# Patient Record
Sex: Male | Born: 1965
Health system: Southern US, Community
[De-identification: ages and names within clinical notes are randomized; demographics above are authoritative.]

## PROBLEM LIST (undated history)

## (undated) DIAGNOSIS — N200 Calculus of kidney: Secondary | ICD-10-CM

## (undated) HISTORY — PX: LITHOTRIPSY: SUR834

## (undated) HISTORY — PX: VASECTOMY: SHX75

## (undated) HISTORY — DX: Calculus of kidney: N20.0

---

## 1999-03-01 ENCOUNTER — Encounter: Payer: Self-pay | Admitting: Family Medicine

## 1999-03-01 ENCOUNTER — Observation Stay (HOSPITAL_COMMUNITY): Admission: EM | Admit: 1999-03-01 | Discharge: 1999-03-01 | Payer: Self-pay | Admitting: Emergency Medicine

## 1999-10-31 ENCOUNTER — Encounter (INDEPENDENT_AMBULATORY_CARE_PROVIDER_SITE_OTHER): Payer: Self-pay | Admitting: Specialist

## 1999-10-31 ENCOUNTER — Other Ambulatory Visit: Admission: RE | Admit: 1999-10-31 | Discharge: 1999-10-31 | Payer: Self-pay | Admitting: Urology

## 2009-12-09 ENCOUNTER — Ambulatory Visit (HOSPITAL_COMMUNITY): Admission: RE | Admit: 2009-12-09 | Discharge: 2009-12-09 | Payer: Self-pay | Admitting: Family Medicine

## 2011-07-30 IMAGING — CT CT ABDOMEN W/ CM
3 of 5 series · 16 of 32 positions shown, 19 images · IV contrast (agent unspecified)
Comparison: None

CLINICAL DATA: Right abdominal pain after trauma.

CT ABDOMEN WITH CONTRAST
TECHNIQUE: Multidetector CT imaging of the abdomen was performed
using the standard protocol following bolus administration of
intravenous contrast.
Contrast: 100 ml Kmnipaque-MYY

[Series 2: routine abdomen · axial · 0.70mm/px · z∈[-276,-176]mm · 2 of 61 slices shown, 5 images]
[im 21/61  soft-tissue]
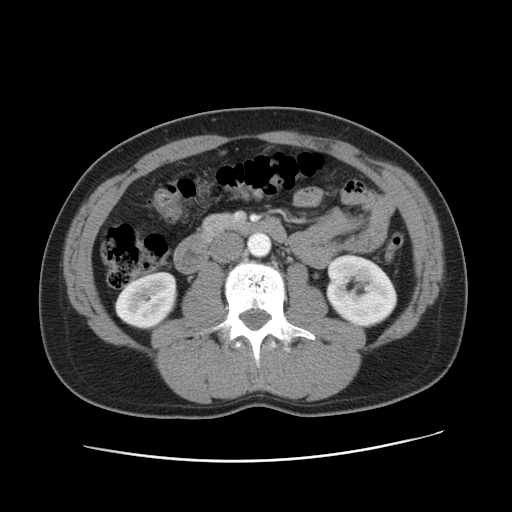
[im 21/61  lung]
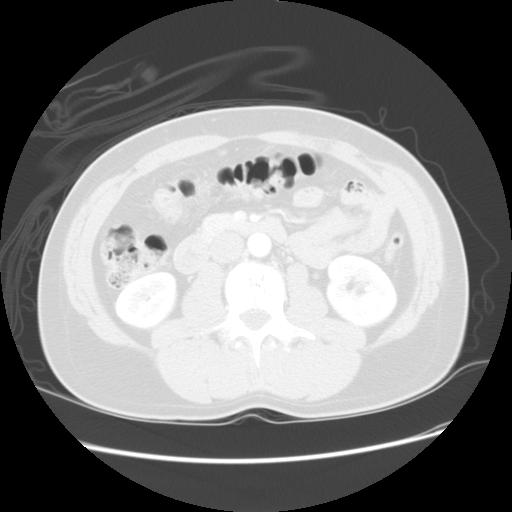
[im 21/61  bone]
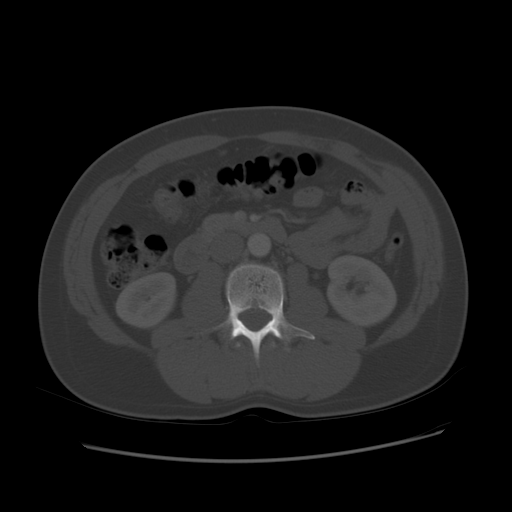
[im 41/61  soft-tissue]
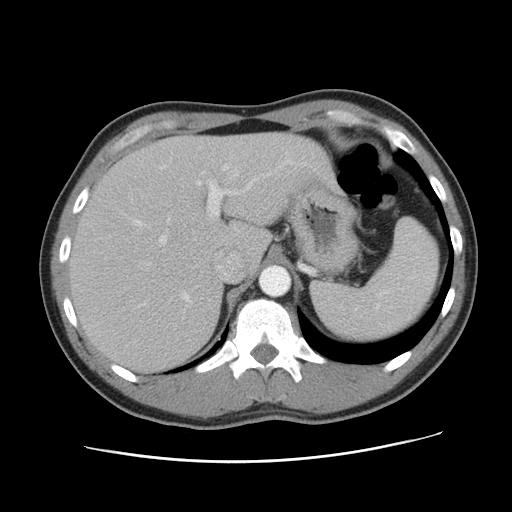
[im 41/61  lung]
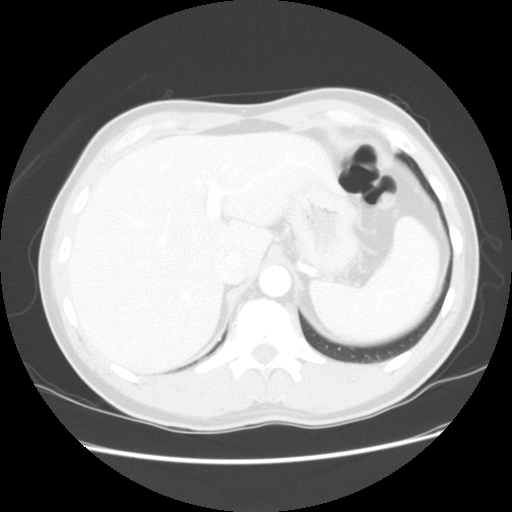

[Series 400: reformatted · sagittal · 0.70mm/px · 8 of 169 slices shown (1 of 2)]
[im 15/169  soft-tissue]
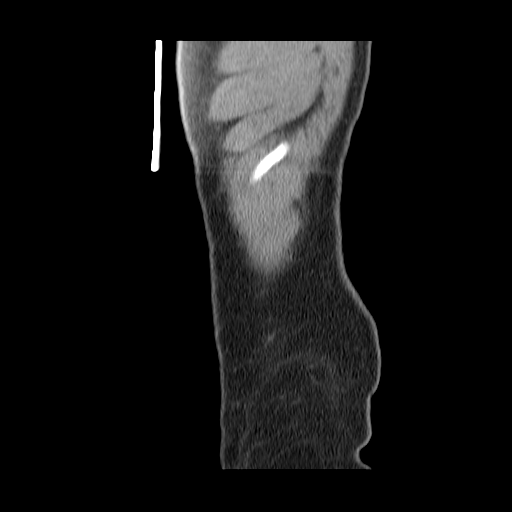
[im 43/169  soft-tissue]
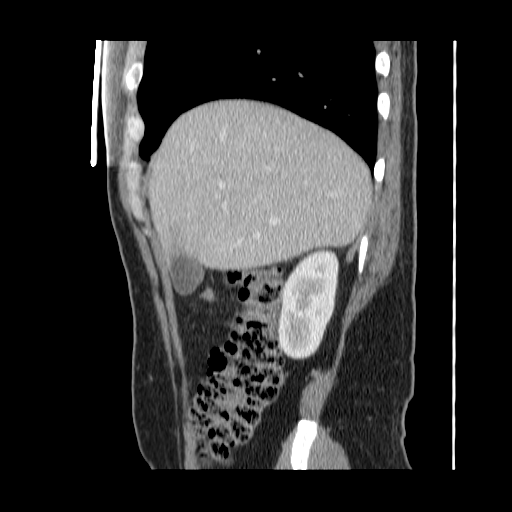
[im 57/169  soft-tissue]
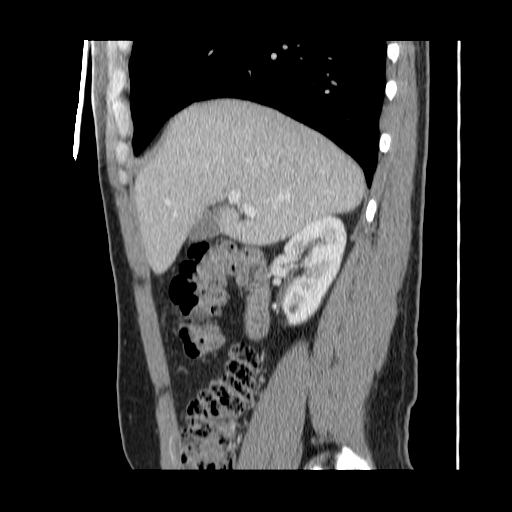
[im 71/169  soft-tissue]
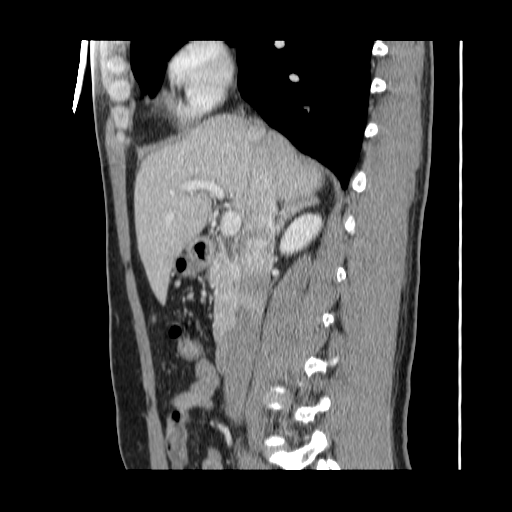
[im 99/169  soft-tissue]
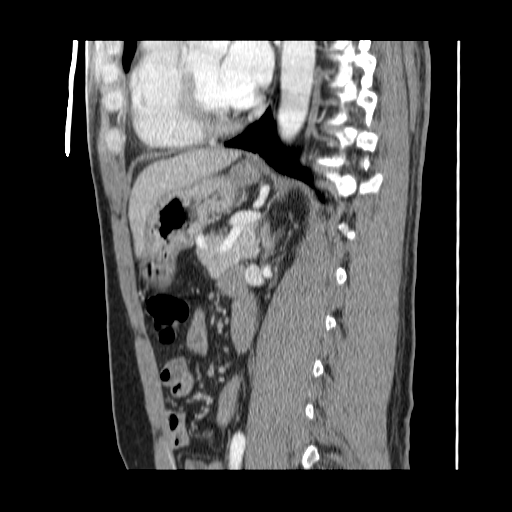
[im 113/169  soft-tissue]
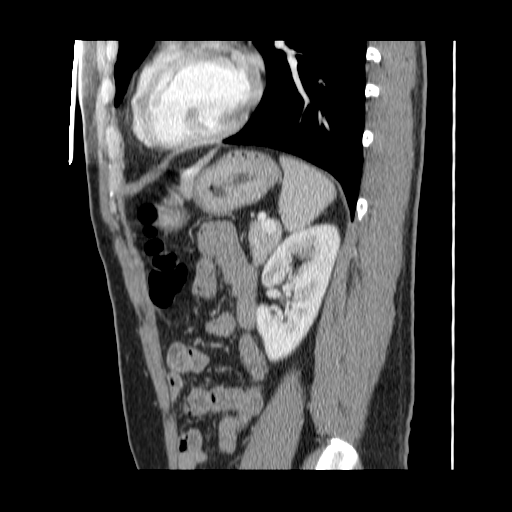
[im 127/169  soft-tissue]
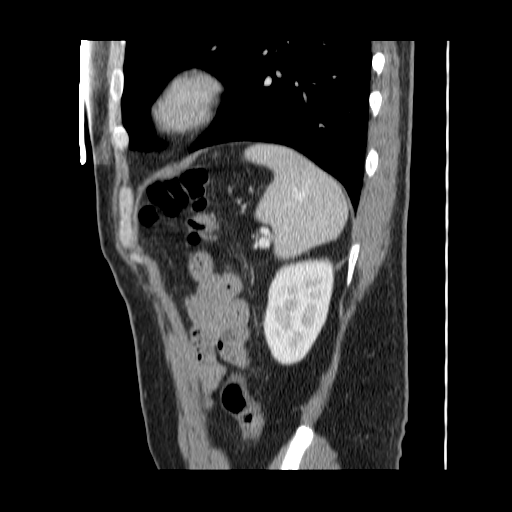
[im 155/169  soft-tissue]
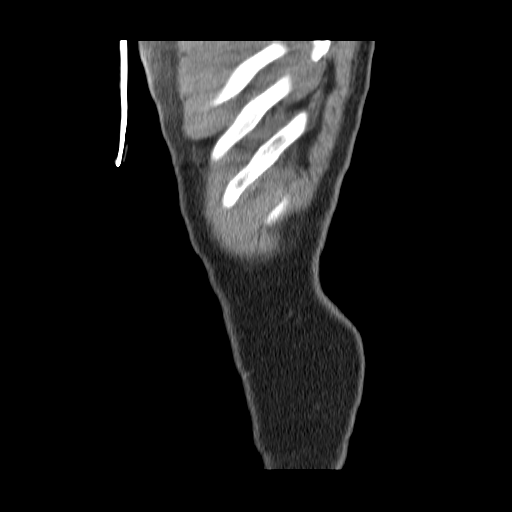

[Series 401: reformatted · coronal · 0.70mm/px · 6 of 133 slices shown (2 of 2)]
[im 15/133  soft-tissue]
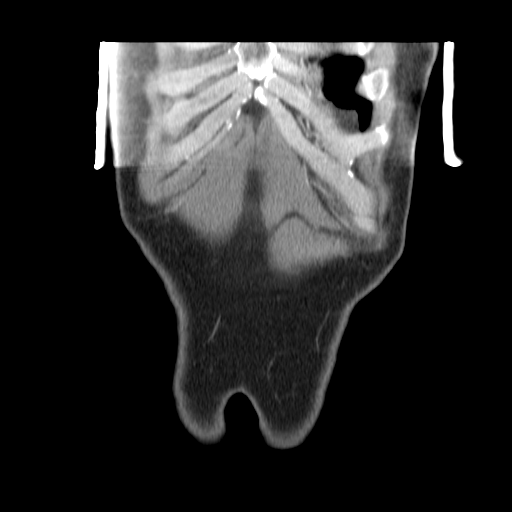
[im 30/133  soft-tissue]
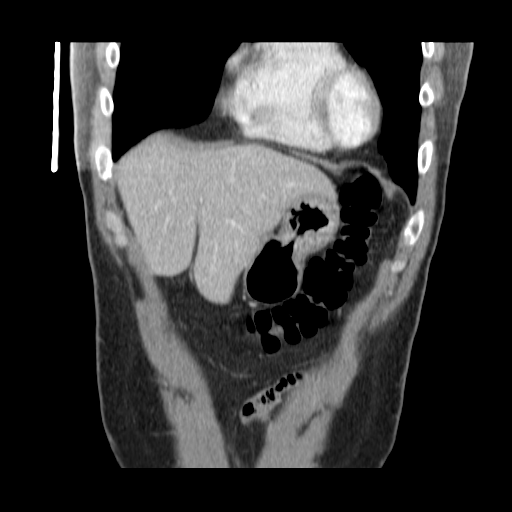
[im 45/133  soft-tissue]
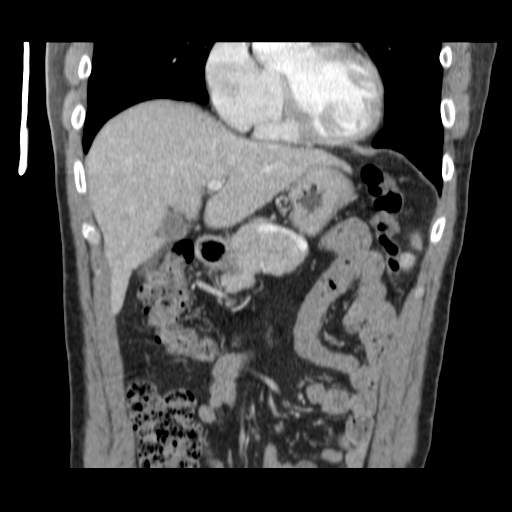
[im 59/133  soft-tissue]
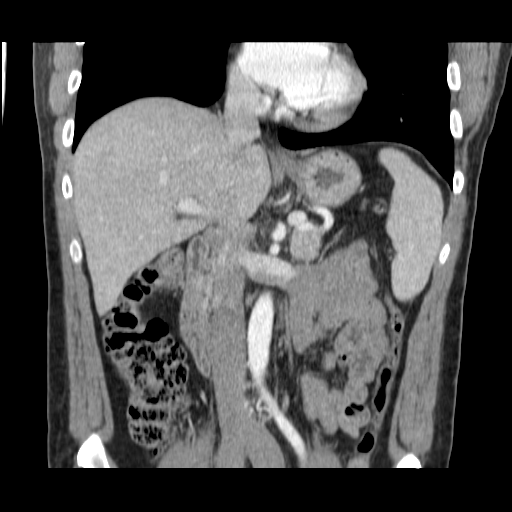
[im 74/133  soft-tissue]
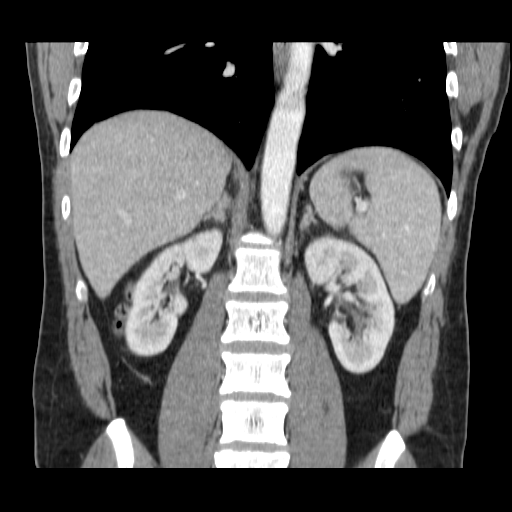
[im 89/133  soft-tissue]
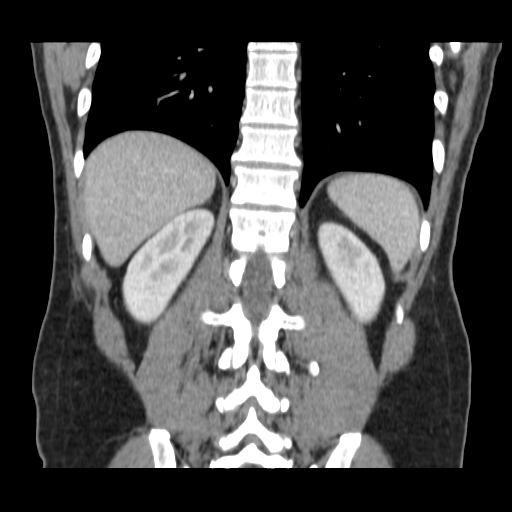

[16 of 32 positions shown; findings below may reference images not displayed]

FINDINGS: The liver, spleen, pancreas, and adrenal glands appear
unremarkable.

The gallbladder and biliary system appear unremarkable.

A 6 mm right kidney upper pole hypodense lesion is likely a cyst.
There is a 2 mm right kidney upper pole nonobstructive calculus.  A
4 mm right kidney lower pole hypodense lesion is likely a cyst.

The left kidney appears normal.

No hydronephrosis or hydroureter identified.

No pathologic retroperitoneal or porta hepatis adenopathy is
identified.

No upper abdominal ascites noted.
IMPRESSION: 1.  2 mm right kidney upper pole nonobstructive calculus.
2.  There are two tiny hypodense right renal lesions likely a cyst.

## 2013-10-29 ENCOUNTER — Ambulatory Visit (INDEPENDENT_AMBULATORY_CARE_PROVIDER_SITE_OTHER): Payer: Commercial Managed Care - PPO | Admitting: Family Medicine

## 2013-10-29 ENCOUNTER — Ambulatory Visit: Payer: Commercial Managed Care - PPO

## 2013-10-29 VITALS — BP 136/86 | HR 60 | Temp 98.0°F | Resp 16 | Ht 68.23 in | Wt 164.2 lb

## 2013-10-29 DIAGNOSIS — R109 Unspecified abdominal pain: Secondary | ICD-10-CM

## 2013-10-29 DIAGNOSIS — N200 Calculus of kidney: Secondary | ICD-10-CM

## 2013-10-29 LAB — POCT URINALYSIS DIPSTICK
Bilirubin, UA: NEGATIVE
Glucose, UA: NEGATIVE
Ketones, UA: NEGATIVE
Leukocytes, UA: NEGATIVE
Nitrite, UA: NEGATIVE
Protein, UA: NEGATIVE
Spec Grav, UA: 1.015
Urobilinogen, UA: 0.2
pH, UA: 5.5

## 2013-10-29 LAB — POCT UA - MICROSCOPIC ONLY
Casts, Ur, LPF, POC: NEGATIVE
Crystals, Ur, HPF, POC: NEGATIVE
Epithelial cells, urine per micros: NEGATIVE
Mucus, UA: NEGATIVE
Yeast, UA: NEGATIVE

## 2013-10-29 MED ORDER — OXYCODONE-ACETAMINOPHEN 5-325 MG PO TABS: 1.0000 | ORAL_TABLET | Freq: Three times a day (TID) | ORAL | Status: DC | PRN

## 2013-10-29 MED ORDER — KETOROLAC TROMETHAMINE 60 MG/2ML IM SOLN
60.0000 mg | Freq: Once | INTRAMUSCULAR | Status: AC
  Administered 2013-10-29: 60 mg via INTRAMUSCULAR

## 2013-10-29 MED ORDER — KETOROLAC TROMETHAMINE 10 MG PO TABS: 10.0000 mg | ORAL_TABLET | Freq: Four times a day (QID) | ORAL | Status: DC | PRN

## 2013-10-29 MED ORDER — TAMSULOSIN HCL 0.4 MG PO CAPS: 0.4000 mg | ORAL_CAPSULE | Freq: Every day | ORAL | Status: DC

## 2013-10-29 NOTE — Progress Notes (Signed)
Subjective:    Patient ID: Jeremy Hunter, male    DOB: 11/26/1965, 48 y.o.   MRN: [id number] Chief Complaint  Patient presents with  . Nephrolithiasis    HPI  1 wk previosly pt had severe right flank pain x 3 hrs - went away on its own - thought he had just overdone it and that it was a back muscle spasm. Then flaired up again 2d prev but this time the pain was much more severe.  Went away o/n and then returned yesterday morning and has been intermittent since. Pain is so severe it is causing him to pace and clutch his flank, causing n/v.  Pain is at the same place as it was a wk ago - has not changed location at all.  Waxes and wanes in intesntiy now - no longer completely leaving him as it had prior. No f/c. No abd pain, n/v due to severe pain only, no c/d.  No urine changes to all - voiding and urine is completely normal. Has been taking some tylenol or motrin for pain but no longer helping - has only used tylenol today, no motrin today. No h/o kidney stones but assumes that this is what it might be as he feels like he is in labor pain is so severe.  Has been drinking water while in the office to provide us a urine sample but can't void right now. Prior to this has not been drinking much water at all - thought that might make kidney stone pain worse.  History reviewed. No pertinent past medical history. No current outpatient prescriptions on file prior to visit.   No current facility-administered medications on file prior to visit.   No Known Allergies   Review of Systems  Constitutional: Negative for fever, chills, diaphoresis, activity change, appetite change and fatigue.  Gastrointestinal: Positive for nausea and vomiting. Negative for abdominal pain, diarrhea, constipation and abdominal distention.  Endocrine: Negative for polydipsia and polyuria.  Genitourinary: Positive for flank pain. Negative for dysuria, urgency, frequency, hematuria, decreased urine volume, discharge, penile  swelling, scrotal swelling, enuresis, difficulty urinating, genital sores, penile pain and testicular pain.  Musculoskeletal: Positive for back pain. Negative for gait problem and joint swelling.  Neurological: Negative for dizziness, syncope and light-headedness.  Hematological: Negative for adenopathy. Does not bruise/bleed easily.  Psychiatric/Behavioral: Negative for sleep disturbance.      BP 136/86  Pulse 60  Temp(Src) 98 F (36.7 C) (Oral)  Resp 16  Ht 5' 8.23" (1.733 m)  Wt 164 lb 4 oz (74.503 kg)  BMI 24.81 kg/m2  SpO2 99% Objective:   Physical Exam  Constitutional: He appears well-developed and well-nourished. No distress.  He is pacing the room clutching his right flank, vomiting due to pain  HENT:  Head: Normocephalic and atraumatic.  Neck: Normal range of motion. Neck supple. No thyromegaly present.  Cardiovascular: Normal rate, regular rhythm and normal heart sounds.   Pulmonary/Chest: Effort normal and breath sounds normal.  Abdominal: Soft. Normal appearance and bowel sounds are normal. He exhibits no distension and no mass. There is no tenderness. There is no rebound, no guarding and no CVA tenderness. No hernia.  Genitourinary: Rectal exam shows no tenderness and anal tone normal.  Lymphadenopathy:    He has no cervical adenopathy.  Skin: He is not diaphoretic.     [facility] reading (PRIMARY) by  Dr. Clelia CroftShaw. KUB: right kidney stone - still near kidney pelvis  EXAM: ABDOMEN - 1 VIEW  COMPARISON: CT abdomen  and pelvis 12/09/2009.  FINDINGS: There is a 0.7 cm in diameter radiopaque density between the left L3 and L4 process consistent with a ureteral stone. No other abnormal calcifications are seen. The bowel gas pattern is normal. No bony abnormality is identified.  IMPRESSION: Findings consistent with a mid left ureteral stone. Assessment & Plan:   Flank pain, acute - Plan: POCT UA - Microscopic Only, POCT urinalysis dipstick, Urine culture, DG Abd 1 View,  ketorolac (TORADOL) injection 60 mg  Right nephrolithiasis - Plan: ketorolac (TORADOL) injection 60 mg - pt has not been drinking much water so will start pushing fluids, start flomax w/ prn pain meds, and strain urine. If flank pain has not moved within 24 hrs, stone may be to big for pt to pass so recommend he call office by tomorrow early afternoon so I can order stat abd CT w/ renal stone protocol to visualize size of stone and refer to urology for further management if >6 mm.  Meds ordered this encounter  Medications  . tamsulosin (FLOMAX) 0.4 MG CAPS capsule    Sig: Take 1 capsule (0.4 mg total) by mouth daily.    Dispense:  30 capsule    Refill:  0  . oxyCODONE-acetaminophen (ROXICET) 5-325 MG per tablet    Sig: Take 1 tablet by mouth every 8 (eight) hours as needed for severe pain.    Dispense:  20 tablet    Refill:  0  . ketorolac (TORADOL) 10 MG tablet    Sig: Take 1 tablet (10 mg total) by mouth every 6 (six) hours as needed.    Dispense:  12 tablet    Refill:  0  . ketorolac (TORADOL) injection 60 mg    Sig:     Norberto Sorenson, MD MPH

## 2013-10-29 NOTE — Patient Instructions (Signed)
Kidney Stones  Kidney stones (urolithiasis) are deposits that form inside your kidneys. The intense pain is caused by the stone moving through the urinary tract. When the stone moves, the ureter goes into spasm around the stone. The stone is usually passed in the urine.   CAUSES   · A disorder that makes certain neck glands produce too much parathyroid hormone (primary hyperparathyroidism).  · A buildup of uric acid crystals, similar to gout in your joints.  · Narrowing (stricture) of the ureter.  · A kidney obstruction present at birth (congenital obstruction).  · Previous surgery on the kidney or ureters.  · Numerous kidney infections.  SYMPTOMS   · Feeling sick to your stomach (nauseous).  · Throwing up (vomiting).  · Blood in the urine (hematuria).  · Pain that usually spreads (radiates) to the groin.  · Frequency or urgency of urination.  DIAGNOSIS   · Taking a history and physical exam.  · Blood or urine tests.  · CT scan.  · Occasionally, an examination of the inside of the urinary bladder (cystoscopy) is performed.  TREATMENT   · Observation.  · Increasing your fluid intake.  · Extracorporeal shock wave lithotripsy This is a noninvasive procedure that uses shock waves to break up kidney stones.  · Surgery may be needed if you have severe pain or persistent obstruction. There are various surgical procedures. Most of the procedures are performed with the use of small instruments. Only small incisions are needed to accommodate these instruments, so recovery time is minimized.  The size, location, and chemical composition are all important variables that will determine the proper choice of action for you. Talk to your health care provider to better understand your situation so that you will minimize the risk of injury to yourself and your kidney.   HOME CARE INSTRUCTIONS   · Drink enough water and fluids to keep your urine clear or pale yellow. This will help you to pass the stone or stone fragments.  · Strain  all urine through the provided strainer. Keep all particulate matter and stones for your health care provider to see. The stone causing the pain may be as small as a grain of salt. It is very important to use the strainer each and every time you pass your urine. The collection of your stone will allow your health care provider to analyze it and verify that a stone has actually passed. The stone analysis will often identify what you can do to reduce the incidence of recurrences.  · Only take over-the-counter or prescription medicines for pain, discomfort, or fever as directed by your health care provider.  · Make a follow-up appointment with your health care provider as directed.  · Get follow-up X-rays if required. The absence of pain does not always mean that the stone has passed. It may have only stopped moving. If the urine remains completely obstructed, it can cause loss of kidney function or even complete destruction of the kidney. It is your responsibility to make sure X-rays and follow-ups are completed. Ultrasounds of the kidney can show blockages and the status of the kidney. Ultrasounds are not associated with any radiation and can be performed easily in a matter of minutes.  SEEK MEDICAL CARE IF:  · You experience pain that is progressive and unresponsive to any pain medicine you have been prescribed.  SEEK IMMEDIATE MEDICAL CARE IF:   · Pain cannot be controlled with the prescribed medicine.  · You have a fever   or shaking chills.  · The severity or intensity of pain increases over 18 hours and is not relieved by pain medicine.  · You develop a new onset of abdominal pain.  · You feel faint or pass out.  · You are unable to urinate.  MAKE SURE YOU:   · Understand these instructions.  · Will watch your condition.  · Will get help right away if you are not doing well or get worse.  Document Released: 05/11/2005 Document Revised: 01/11/2013 Document Reviewed: 10/12/2012  [employer]® Patient Information ©2014  [employer].

## 2013-10-30 ENCOUNTER — Telehealth: Payer: Self-pay

## 2013-10-30 DIAGNOSIS — N200 Calculus of kidney: Secondary | ICD-10-CM

## 2013-10-30 LAB — URINE CULTURE
Colony Count: NO GROWTH
Organism ID, Bacteria: NO GROWTH

## 2013-10-30 NOTE — Telephone Encounter (Signed)
Pt saw Clelia Croft yesterday and was told to call back if he wasn't any better.  Wife says he is throwing up if he eats and he cant take the pain medicine without eating.  He tried taking the medicine by itself and threw it up also.  Please call [phone]

## 2013-10-31 ENCOUNTER — Other Ambulatory Visit: Payer: Self-pay | Admitting: *Deleted

## 2013-10-31 DIAGNOSIS — N200 Calculus of kidney: Secondary | ICD-10-CM

## 2013-10-31 MED ORDER — ONDANSETRON 8 MG PO TBDP: 8.0000 mg | ORAL_TABLET | Freq: Three times a day (TID) | ORAL | Status: DC | PRN

## 2013-10-31 NOTE — Telephone Encounter (Signed)
Jeremy Hunter stated Pt is feeling better this morning. He did go to work today. He works until 230pm. He is going to call us back to discuss the CT Scan. Wife says to hold off on scheduling CT until we talk to him.

## 2013-10-31 NOTE — Telephone Encounter (Signed)
Spoke to patient. He is not in very much pain. He states he just feels "strange" We figured out he has not been taking the medication properly. Went over directions for pain medication and flomax. Advised pt to p/u Zofran at the pharmacy. Pt was taking Oxicodone every 4 hours, Ketoralac at bedtime, Flomax every 6 hours. Advised him to stop taking the Oxicodone medication unless he needs it due to pain. Pt reports he has not had a bowel movement since Sunday. Advised him to get some stool softener, increase fluids since he took so much Oxicodone. Pt would like to proceed with the referral to Urology instead of the CT Scan. He declined injection at this time-wants to try to take the medication properly.

## 2013-10-31 NOTE — Telephone Encounter (Signed)
Oh no! I am assuming the nausea and vomiting is from the pain severity and not just from the toradol and oxycodone. . .   Lets proceed w/ a stat abd CT - renal stone protocol this morning - referral entered and come by the office first thing this morning - fast track for toradol injection (which he received in the office 6/7 and seemed to help some w/ the pain after 30-60 min). Have also sent nausea medicine to the pharmacy in hopes that he can at least then keep down the pain medicine.

## 2013-10-31 NOTE — Telephone Encounter (Signed)
Agree w excellent plan.

## 2013-11-01 ENCOUNTER — Telehealth: Payer: Self-pay | Admitting: *Deleted

## 2013-11-01 NOTE — Telephone Encounter (Signed)
Lm for pt to rtn call- Follow up to see how he is doing after correcting his medication.

## 2013-11-03 NOTE — Telephone Encounter (Signed)
Left message on machine to call back  

## 2014-04-05 ENCOUNTER — Ambulatory Visit (INDEPENDENT_AMBULATORY_CARE_PROVIDER_SITE_OTHER): Payer: Commercial Managed Care - PPO

## 2014-04-05 ENCOUNTER — Ambulatory Visit (INDEPENDENT_AMBULATORY_CARE_PROVIDER_SITE_OTHER): Payer: Commercial Managed Care - PPO | Admitting: Family Medicine

## 2014-04-05 VITALS — BP 138/82 | HR 86 | Temp 98.3°F | Resp 22

## 2014-04-05 DIAGNOSIS — R109 Unspecified abdominal pain: Secondary | ICD-10-CM

## 2014-04-05 DIAGNOSIS — R10A1 Flank pain, right side: Secondary | ICD-10-CM

## 2014-04-05 DIAGNOSIS — R112 Nausea with vomiting, unspecified: Secondary | ICD-10-CM

## 2014-04-05 DIAGNOSIS — N200 Calculus of kidney: Secondary | ICD-10-CM

## 2014-04-05 LAB — POCT URINALYSIS DIPSTICK
BILIRUBIN UA: NEGATIVE
GLUCOSE UA: NEGATIVE
Leukocytes, UA: NEGATIVE
Nitrite, UA: NEGATIVE
Protein, UA: 30
Urobilinogen, UA: 0.2
pH, UA: 5

## 2014-04-05 LAB — POCT CBC
GRANULOCYTE PERCENT: 83.1 % — AB (ref 37–80)
HEMATOCRIT: 44.8 % (ref 43.5–53.7)
Hemoglobin: 15.2 g/dL (ref 14.1–18.1)
LYMPH, POC: 1.3 (ref 0.6–3.4)
MCH, POC: 29.1 pg (ref 27–31.2)
MCHC: 34.1 g/dL (ref 31.8–35.4)
MCV: 85.4 fL (ref 80–97)
MID (CBC): 1 — AB (ref 0–0.9)
MPV: 6.9 fL (ref 0–99.8)
PLATELET COUNT, POC: 281 10*3/uL (ref 142–424)
POC Granulocyte: 11.4 — AB (ref 2–6.9)
POC LYMPH %: 9.7 % — AB (ref 10–50)
POC MID %: 7.2 %M (ref 0–12)
RBC: 5.25 M/uL (ref 4.69–6.13)
RDW, POC: 13 %
WBC: 13.7 10*3/uL — AB (ref 4.6–10.2)

## 2014-04-05 LAB — POCT UA - MICROSCOPIC ONLY
CRYSTALS, UR, HPF, POC: NEGATIVE
Casts, Ur, LPF, POC: NEGATIVE
Mucus, UA: NEGATIVE
Yeast, UA: NEGATIVE

## 2014-04-05 MED ORDER — KETOROLAC TROMETHAMINE 10 MG PO TABS: 10.0000 mg | ORAL_TABLET | Freq: Four times a day (QID) | ORAL | Status: DC | PRN

## 2014-04-05 MED ORDER — PROMETHAZINE HCL 25 MG/ML IJ SOLN
25.0000 mg | Freq: Once | INTRAMUSCULAR | Status: AC
  Administered 2014-04-05: 25 mg via INTRAMUSCULAR

## 2014-04-05 MED ORDER — ONDANSETRON 8 MG PO TBDP: 8.0000 mg | ORAL_TABLET | Freq: Three times a day (TID) | ORAL | Status: DC | PRN

## 2014-04-05 MED ORDER — OXYCODONE-ACETAMINOPHEN 5-325 MG PO TABS: 1.0000 | ORAL_TABLET | Freq: Four times a day (QID) | ORAL | Status: DC | PRN

## 2014-04-05 MED ORDER — KETOROLAC TROMETHAMINE 60 MG/2ML IM SOLN
60.0000 mg | Freq: Once | INTRAMUSCULAR | Status: AC
  Administered 2014-04-05: 60 mg via INTRAMUSCULAR

## 2014-04-05 NOTE — Patient Instructions (Signed)
1.  CALL UROLOGIST TOMORROW FOR AN APPOINTMENT.   Kidney Stones Kidney stones (urolithiasis) are deposits that form inside your kidneys. The intense pain is caused by the stone moving through the urinary tract. When the stone moves, the ureter goes into spasm around the stone. The stone is usually passed in the urine.  CAUSES   A disorder that makes certain neck glands produce too much parathyroid hormone (primary hyperparathyroidism).  A buildup of uric acid crystals, similar to gout in your joints.  Narrowing (stricture) of the ureter.  A kidney obstruction present at birth (congenital obstruction).  Previous surgery on the kidney or ureters.  Numerous kidney infections. SYMPTOMS   Feeling sick to your stomach (nauseous).  Throwing up (vomiting).  Blood in the urine (hematuria).  Pain that usually spreads (radiates) to the groin.  Frequency or urgency of urination. DIAGNOSIS   Taking a history and physical exam.  Blood or urine tests.  CT scan.  Occasionally, an examination of the inside of the urinary bladder (cystoscopy) is performed. TREATMENT   Observation.  Increasing your fluid intake.  Extracorporeal shock wave lithotripsy--This is a noninvasive procedure that uses shock waves to break up kidney stones.  Surgery may be needed if you have severe pain or persistent obstruction. There are various surgical procedures. Most of the procedures are performed with the use of small instruments. Only small incisions are needed to accommodate these instruments, so recovery time is minimized. The size, location, and chemical composition are all important variables that will determine the proper choice of action for you. Talk to your health care provider to better understand your situation so that you will minimize the risk of injury to yourself and your kidney.  HOME CARE INSTRUCTIONS   Drink enough water and fluids to keep your urine clear or pale yellow. This will help  you to pass the stone or stone fragments.  Strain all urine through the provided strainer. Keep all particulate matter and stones for your health care provider to see. The stone causing the pain may be as small as a grain of salt. It is very important to use the strainer each and every time you pass your urine. The collection of your stone will allow your health care provider to analyze it and verify that a stone has actually passed. The stone analysis will often identify what you can do to reduce the incidence of recurrences.  Only take over-the-counter or prescription medicines for pain, discomfort, or fever as directed by your health care provider.  Make a follow-up appointment with your health care provider as directed.  Get follow-up X-rays if required. The absence of pain does not always mean that the stone has passed. It may have only stopped moving. If the urine remains completely obstructed, it can cause loss of kidney function or even complete destruction of the kidney. It is your responsibility to make sure X-rays and follow-ups are completed. Ultrasounds of the kidney can show blockages and the status of the kidney. Ultrasounds are not associated with any radiation and can be performed easily in a matter of minutes. SEEK MEDICAL CARE IF:  You experience pain that is progressive and unresponsive to any pain medicine you have been prescribed. SEEK IMMEDIATE MEDICAL CARE IF:   Pain cannot be controlled with the prescribed medicine.  You have a fever or shaking chills.  The severity or intensity of pain increases over 18 hours and is not relieved by pain medicine.  You develop a new onset  of abdominal pain.  You feel faint or pass out.  You are unable to urinate. MAKE SURE YOU:   Understand these instructions.  Will watch your condition.  Will get help right away if you are not doing well or get worse. Document Released: 05/11/2005 Document Revised: 01/11/2013 Document  Reviewed: 10/12/2012 [facility] Patient Information 2015 [place], [place]. This information is not intended to replace advice given to you by your health care provider. Make sure you discuss any questions you have with your health care provider.

## 2014-04-05 NOTE — Progress Notes (Addendum)
Subjective:  This chart was scribed for [name], MD by Elveria Risingimelie Horne, Medial Scribe. This patient was seen in room 12 and the patient's care was started at 8:13 PM.    Patient ID: Jeremy Hunter, male    DOB: 11/06/1965, 48 y.o.   MRN: [id number]  04/05/2014  Kidney Stone   HPI HPI Comments: Jeremy Hunter is a 48 y.o. male with history of kidney stone who presents to the [facility] complaining of pain associated to kidney stone. Patient reports nausea, vomiting, chills, and diaphoresis onset three hours ago. However, patient reports mild discomfort felt throughout the day that climaxed this evening. Patient reports similar pain felt with his last occurrence in 11/2013, but his current pain is more severe. Patient has taken Oxycodone at 5:00pm which historically provides relief; today his dose did not touch his pain. Pt did vomit immediately after taking  Oxycodone dose.    Urology note, dated 12/07/13, shows visualization of 7 mm kidney stone in right ureter. Moderate blood at the urology visit.  Repeat KUB indicated tht the stone had moved down to mid ureter just proximal to right UVJ.  Pt does not think that he ever passed the stone. He has been pain free since urology visit on 12/07/13 until today. He has been training for a marathon.  Denies diarrhea or constipation.  Denies dysuria or hematuria.  Pain today is in R flank and radiates to R abdomen just like in July 2015.   Review of Systems  Constitutional: Positive for diaphoresis. Negative for fever, chills and fatigue.  HENT: Negative for congestion.   Respiratory: Negative for cough and shortness of breath.   Cardiovascular: Negative for chest pain, palpitations and leg swelling.  Gastrointestinal: Positive for nausea and vomiting. Negative for abdominal pain, diarrhea, constipation, blood in stool, abdominal distention, anal bleeding and rectal pain.  Genitourinary: Positive for flank pain. Negative for dysuria,  urgency, frequency, hematuria, decreased urine volume, discharge, penile swelling, scrotal swelling, difficulty urinating, genital sores, penile pain and testicular pain.  Skin: Negative for rash.  Neurological: Negative for headaches.    Past Medical History  Diagnosis Date  . Nephrolithiasis    Past Surgical History  Procedure Laterality Date  . Vasectomy     No Known Allergies Current Outpatient Prescriptions  Medication Sig Dispense Refill  . ketorolac (TORADOL) 10 MG tablet Take 1 tablet (10 mg total) by mouth every 6 (six) hours as needed. 20 tablet 0  . ondansetron (ZOFRAN-ODT) 8 MG disintegrating tablet Take 1 tablet (8 mg total) by mouth every 8 (eight) hours as needed for nausea. 21 tablet 0  . oxyCODONE-acetaminophen (ROXICET) 5-325 MG per tablet Take 1 tablet by mouth every 6 (six) hours as needed for severe pain. 30 tablet 0  . tamsulosin (FLOMAX) 0.4 MG CAPS capsule Take 1 capsule (0.4 mg total) by mouth daily. 30 capsule 0   No current facility-administered medications for this visit.       Objective:    BP 138/82 mmHg  Pulse 86  Temp(Src) 98.3 F (36.8 C) (Oral)  Resp 22  SpO2 99% Physical Exam  Constitutional: He is oriented to person, place, and time. He appears well-developed and well-nourished.  Diaphoretic; pale; sweating profusely.  Pacing the exam room or leaning forward onto B knees.  HENT:  Head: Normocephalic and atraumatic.  Eyes: Conjunctivae and EOM are normal. Pupils are equal, round, and reactive to light.  Neck: Normal range of motion. Neck supple. Carotid bruit  is not present. No thyromegaly present.  Cardiovascular: Normal rate, regular rhythm, normal heart sounds and intact distal pulses.  Exam reveals no gallop and no friction rub.   No murmur heard. Pulmonary/Chest: Effort normal and breath sounds normal. He has no wheezes. He has no rales.  Abdominal: Soft. Bowel sounds are normal. He exhibits no distension and no mass. There is no  tenderness. There is no rebound and no guarding.  Lymphadenopathy:    He has no cervical adenopathy.  Neurological: He is alert and oriented to person, place, and time. No cranial nerve deficit.  Skin: Skin is warm and dry. No rash noted. He is not diaphoretic.  Psychiatric: He has a normal mood and affect. His behavior is normal.  Nursing note and vitals reviewed.  Results for orders placed or performed in visit on 04/05/14  Urine culture  Result Value Ref Range   Colony Count NO GROWTH    Organism ID, Bacteria NO GROWTH   Comprehensive metabolic panel  Result Value Ref Range   Sodium 140 135 - 145 mEq/L   Potassium 4.2 3.5 - 5.3 mEq/L   Chloride 107 96 - 112 mEq/L   CO2 20 19 - 32 mEq/L   Glucose, Bld 135 (H) 70 - 99 mg/dL   BUN 12 6 - 23 mg/dL   Creat 1.611.01 0.960.50 - 0.451.35 mg/dL   Total Bilirubin 0.9 0.2 - 1.2 mg/dL   Alkaline Phosphatase 59 39 - 117 U/L   AST 19 0 - 37 U/L   ALT 13 0 - 53 U/L   Total Protein 6.9 6.0 - 8.3 g/dL   Albumin 4.5 3.5 - 5.2 g/dL   Calcium 9.5 8.4 - 40.910.5 mg/dL  POCT urinalysis dipstick  Result Value Ref Range   Color, UA amber    Clarity, UA hazy    Glucose, UA neg    Bilirubin, UA neg    Ketones, UA trace    Spec Grav, UA >=1.030    Blood, UA large    pH, UA 5.0    Protein, UA 30    Urobilinogen, UA 0.2    Nitrite, UA neg    Leukocytes, UA Negative   POCT UA - Microscopic Only  Result Value Ref Range   WBC, Ur, HPF, POC 1-3    RBC, urine, microscopic 20-25    Bacteria, U Microscopic trace    Mucus, UA neg    Epithelial cells, urine per micros 1-2    Crystals, Ur, HPF, POC neg    Casts, Ur, LPF, POC neg    Yeast, UA neg   POCT CBC  Result Value Ref Range   WBC 13.7 (A) 4.6 - 10.2 K/uL   Lymph, poc 1.3 0.6 - 3.4   POC LYMPH PERCENT 9.7 (A) 10 - 50 %L   MID (cbc) 1.0 (A) 0 - 0.9   POC MID % 7.2 0 - 12 %M   POC Granulocyte 11.4 (A) 2 - 6.9   Granulocyte percent 83.1 (A) 37 - 80 %G   RBC 5.25 4.69 - 6.13 M/uL   Hemoglobin 15.2 14.1 -  18.1 g/dL   HCT, POC 81.144.8 91.443.5 - 53.7 %   MCV 85.4 80 - 97 fL   MCH, POC 29.1 27 - 31.2 pg   MCHC 34.1 31.8 - 35.4 g/dL   RDW, POC 78.213.0 %   Platelet Count, POC 281 142 - 424 K/uL   MPV 6.9 0 - 99.8 fL   [facility] reading (PRIMARY) by  Dr. Katrinka Blazing.  KUB: +persistent stone  TORADOL 60MG  IM AND PHENERGAN 25MG  IM ADMINISTRED.  IV PLACEMENT BY SARAH WEBER, PA-C.  2 LITERS IVF ADMINISTERED IN OFFICE.    Assessment & Plan:   1. Right flank pain   2. Non-intractable vomiting with nausea, vomiting of unspecified type   3. Nephrolithiasis      1.  Acute nephrolithiasis:  New. Presenting with R flank pain, microscopic hematuria, n/v.  S/p Toradol and Phenergan in office with symptomatic improvement.  Rx for Toradol and Oxycodone and Zofran provided. Advised to restart Flomax.  To ED overnight if unable to maintain pain.  Call urologist tomorrow for appointment.  Strain urine.   S/p 2 liters ivf in office.      Meds ordered this encounter  Medications  . promethazine (PHENERGAN) injection 25 mg    Sig:   . ketorolac (TORADOL) injection 60 mg    Sig:   . DISCONTD: ketorolac (TORADOL) 10 MG tablet    Sig: Take 1 tablet (10 mg total) by mouth every 6 (six) hours as needed.    Dispense:  20 tablet    Refill:  0  . ketorolac (TORADOL) 10 MG tablet    Sig: Take 1 tablet (10 mg total) by mouth every 6 (six) hours as needed.    Dispense:  20 tablet    Refill:  0  . ondansetron (ZOFRAN-ODT) 8 MG disintegrating tablet    Sig: Take 1 tablet (8 mg total) by mouth every 8 (eight) hours as needed for nausea.    Dispense:  21 tablet    Refill:  0  . oxyCODONE-acetaminophen (ROXICET) 5-325 MG per tablet    Sig: Take 1 tablet by mouth every 6 (six) hours as needed for severe pain.    Dispense:  30 tablet    Refill:  0    No Follow-up on file.    I personally performed the services described in this documentation, which was scribed in my presence. The recorded information has been reviewed and is  accurate.  Nilda Simmer, M.D.  [facility] [address] [place], [place]  [address] [phone] phone [phone] fax

## 2014-04-06 LAB — COMPREHENSIVE METABOLIC PANEL
ALBUMIN: 4.5 g/dL (ref 3.5–5.2)
ALK PHOS: 59 U/L (ref 39–117)
ALT: 13 U/L (ref 0–53)
AST: 19 U/L (ref 0–37)
BILIRUBIN TOTAL: 0.9 mg/dL (ref 0.2–1.2)
BUN: 12 mg/dL (ref 6–23)
CO2: 20 mEq/L (ref 19–32)
Calcium: 9.5 mg/dL (ref 8.4–10.5)
Chloride: 107 mEq/L (ref 96–112)
Creat: 1.01 mg/dL (ref 0.50–1.35)
GLUCOSE: 135 mg/dL — AB (ref 70–99)
POTASSIUM: 4.2 meq/L (ref 3.5–5.3)
Sodium: 140 mEq/L (ref 135–145)
Total Protein: 6.9 g/dL (ref 6.0–8.3)

## 2014-04-07 LAB — URINE CULTURE
Colony Count: NO GROWTH
ORGANISM ID, BACTERIA: NO GROWTH

## 2014-04-30 ENCOUNTER — Other Ambulatory Visit: Payer: Self-pay | Admitting: Urology

## 2014-05-16 ENCOUNTER — Encounter (HOSPITAL_COMMUNITY): Payer: Self-pay | Admitting: *Deleted

## 2014-05-21 ENCOUNTER — Ambulatory Visit (HOSPITAL_COMMUNITY): Payer: Commercial Managed Care - PPO

## 2014-05-21 ENCOUNTER — Encounter (HOSPITAL_COMMUNITY): Payer: Self-pay | Admitting: General Practice

## 2014-05-21 ENCOUNTER — Encounter (HOSPITAL_COMMUNITY)
Admission: RE | Disposition: A | Discharge status: Home / Self Care | Payer: Self-pay | Source: Ambulatory Visit | Attending: Urology

## 2014-05-21 ENCOUNTER — Ambulatory Visit (HOSPITAL_COMMUNITY)
Admission: RE | Admit: 2014-05-21 | Discharge: 2014-05-21 | Disposition: A | Discharge status: Home / Self Care | Payer: Commercial Managed Care - PPO | Source: Ambulatory Visit | Attending: Urology | Admitting: Urology

## 2014-05-21 ENCOUNTER — Other Ambulatory Visit: Payer: Self-pay | Admitting: Urology

## 2014-05-21 DIAGNOSIS — N201 Calculus of ureter: Secondary | ICD-10-CM

## 2014-05-21 SURGERY — LITHOTRIPSY, ESWL
Anesthesia: LOCAL | Laterality: Right

## 2014-05-21 MED ORDER — SODIUM CHLORIDE 0.9 % IV SOLN
INTRAVENOUS | Status: DC
  Administered 2014-05-21: 15:00:00 via INTRAVENOUS

## 2014-05-21 MED ORDER — DIPHENHYDRAMINE HCL 25 MG PO CAPS
25.0000 mg | ORAL_CAPSULE | ORAL | Status: AC
  Administered 2014-05-21: 25 mg via ORAL
  Filled 2014-05-21: qty 1

## 2014-05-21 MED ORDER — CIPROFLOXACIN HCL 500 MG PO TABS
500.0000 mg | ORAL_TABLET | ORAL | Status: AC
  Administered 2014-05-21: 500 mg via ORAL
  Filled 2014-05-21: qty 1

## 2014-05-21 MED ORDER — DIAZEPAM 5 MG PO TABS
10.0000 mg | ORAL_TABLET | ORAL | Status: AC
  Administered 2014-05-21: 10 mg via ORAL
  Filled 2014-05-21: qty 2

## 2014-05-21 NOTE — Discharge Instructions (Signed)
Follow [facility] Discharge instructions.   Patient to continue all new home medications. Two new prescriptions for   1.  Cipro 250 mg every 12 hours for 3 days (antibiotic).  2.  Zofran 4 mg every 8 hours as needed for nausea.

## 2014-05-21 NOTE — Progress Notes (Signed)
Patient was to have ESWL procedure by Dr Marlou PorchHerrick. Due to scheduling conflict, Dr Sherron MondayMacDiarmid did lithotripsy procedure. Dr Sherron MondayMacDiarmid was unable to enter orders as case was posted under Dr Marlou PorchHerrick. Verbal order received to discharge patient. Discharge instructions included free text about new medications.

## 2015-05-02 ENCOUNTER — Ambulatory Visit (INDEPENDENT_AMBULATORY_CARE_PROVIDER_SITE_OTHER): Payer: Commercial Managed Care - PPO | Admitting: Physician Assistant

## 2015-05-02 VITALS — BP 120/82 | HR 56 | Temp 98.2°F | Resp 17 | Ht 69.0 in | Wt 161.8 lb

## 2015-05-02 DIAGNOSIS — Z130 Encounter for screening for diseases of the blood and blood-forming organs and certain disorders involving the immune mechanism: Secondary | ICD-10-CM | POA: Diagnosis not present

## 2015-05-02 DIAGNOSIS — Z13220 Encounter for screening for lipoid disorders: Secondary | ICD-10-CM

## 2015-05-02 DIAGNOSIS — Z13228 Encounter for screening for other metabolic disorders: Secondary | ICD-10-CM | POA: Diagnosis not present

## 2015-05-02 DIAGNOSIS — Z125 Encounter for screening for malignant neoplasm of prostate: Secondary | ICD-10-CM

## 2015-05-02 DIAGNOSIS — Z1329 Encounter for screening for other suspected endocrine disorder: Secondary | ICD-10-CM

## 2015-05-02 DIAGNOSIS — Z0000 Encounter for general adult medical examination without abnormal findings: Secondary | ICD-10-CM | POA: Diagnosis not present

## 2015-05-02 NOTE — Progress Notes (Signed)
[facility] [address], [place] [address] [phone]  Date:  05/02/2015   Name:  Jeremy DawleyJames W Hunter   DOB:  03/04/1966   MRN:  [id number]  PCP:  No primary care provider on file.    History of Present Illness:  Jeremy Hunter is a 49 y.o. male patient who [facility].  Diet: No restrictions, a lot of things he should not but has pizza.  Vegetables with salads.  Water intake: couple bottles per day.  Exercising: train for .5 marathon.  Mountain dew is his "go to"--at least 1 can.    BM: fine.  No blood in stool, constipation, or diarrhea  Urination: no urinary sxs of hematuria, dysuria, dribbling, weak stream, or frequency.  Sleep: good.  Sleeping through the night, getting to sleep.   Social activity: movies, hang out with friends, football games.  Married, 781 51 year old daughter.   --Designer, industrial/productwarehouse manager, EtOh: 0 Tobacco use: 0 Illicit drug use 0  There are no active problems to display for this patient.   Past Medical History  Diagnosis Date  . Nephrolithiasis     right ureteral stone    Past Surgical History  Procedure Laterality Date  . Vasectomy      Social History  Substance Use Topics  . Smoking status: Never Smoker   . Smokeless tobacco: Never Used  . Alcohol Use: No    Family History  Problem Relation Age of Onset  . Heart disease Mother   . Heart disease Father     No Known Allergies  Medication list has been reviewed and updated.  Current Outpatient Prescriptions on File Prior to Visit  Medication Sig Dispense Refill  . aspirin 325 MG EC tablet Take 325 mg by mouth every morning.    Marland Kitchen. acetaminophen (TYLENOL) 500 MG tablet Take 1,000 mg by mouth every 6 (six) hours as needed for moderate pain or headache.    . ketorolac (TORADOL) 10 MG tablet Take 1 tablet (10 mg total) by mouth every 6 (six) hours as needed. (Patient not taking: Reported on 05/02/2015) 20 tablet 0  . ondansetron (ZOFRAN-ODT) 8  MG disintegrating tablet Take 1 tablet (8 mg total) by mouth every 8 (eight) hours as needed for nausea. (Patient not taking: Reported on 05/02/2015) 21 tablet 0  . oxyCODONE-acetaminophen (ROXICET) 5-325 MG per tablet Take 1 tablet by mouth every 6 (six) hours as needed for severe pain. (Patient not taking: Reported on 05/02/2015) 30 tablet 0  . tamsulosin (FLOMAX) 0.4 MG CAPS capsule Take 1 capsule (0.4 mg total) by mouth daily. (Patient not taking: Reported on 05/02/2015) 30 capsule 0   No current facility-administered medications on file prior to visit.    Review of Systems  Constitutional: Negative for fever and chills.  HENT: Negative for ear discharge, ear pain and sore throat.   Eyes: Negative for blurred vision and double vision.  Respiratory: Negative for cough, shortness of breath and wheezing.   Cardiovascular: Negative for chest pain, palpitations and leg swelling.  Gastrointestinal: Negative for nausea, vomiting and diarrhea.  Genitourinary: Negative for dysuria, frequency and hematuria.  Skin: Negative for itching and rash.  Neurological: Negative for dizziness and headaches.     Physical Examination: BP 120/82 mmHg  Pulse 56  Temp(Src) 98.2 F (36.8 C) (Oral)  Resp 17  Ht 5\' 9"  (1.753 m)  Wt 161 lb 12.8 oz (73.392 kg)  BMI 23.88 kg/m2  SpO2  99% Ideal Body Weight: Weight in (lb) to have BMI = 25: 168.9  Physical Exam  Constitutional: He is oriented to person, place, and time. He appears well-developed and well-nourished. No distress.  HENT:  Head: Normocephalic and atraumatic.  Right Ear: Tympanic membrane, external ear and ear canal normal.  Left Ear: Tympanic membrane, external ear and ear canal normal.  Nose: Nose normal.  Mouth/Throat: Oropharynx is clear and moist.  Eyes: Conjunctivae and EOM are normal. Pupils are equal, round, and reactive to light. No scleral icterus.  Neck: Normal range of motion. Neck supple. No thyromegaly present.  Cardiovascular:  Normal rate and regular rhythm.  Exam reveals no gallop and no friction rub.   No murmur heard. Pulses:      Radial pulses are 2+ on the right side, and 2+ on the left side.       Dorsalis pedis pulses are 2+ on the right side, and 2+ on the left side.  Pulmonary/Chest: Effort normal. No apnea. No respiratory distress. He has no decreased breath sounds. He has no wheezes. He has no rhonchi.  Abdominal: Soft. Bowel sounds are normal. He exhibits no distension and no mass. There is no tenderness.  Musculoskeletal: Normal range of motion. He exhibits no edema or tenderness.  Lymphadenopathy:    He has no cervical adenopathy.  Neurological: He is alert and oriented to person, place, and time. He displays normal reflexes.  Skin: Skin is warm and dry. He is not diaphoretic.  Psychiatric: He has a normal mood and affect. His behavior is normal.     Assessment and Plan: Jeremy Hunter is a 49 y.o. male who is here today for an annual physical. Declines flu vaccine today Declines tdap today Declines colonoscopy. Declines manual prostate at this time.  Will do PSA.   Request phone call and send to mychart once he opens.  Annual physical exam - Plan: COMPLETE METABOLIC PANEL WITH GFR, TSH, PSA, Lipid panel, CBC  Screening for thyroid disorder - Plan: TSH  Screening for prostate cancer - Plan: PSA  Screening for lipid disorders - Plan: Lipid panel  Screening for deficiency anemia - Plan: CBC  Screening for metabolic disorder - Plan: COMPLETE METABOLIC PANEL WITH GFR   Jeremy Platt, PA-C [facility] 05/02/2015 6:21 PM

## 2015-05-02 NOTE — Patient Instructions (Signed)
I will have your lab results within the next 10 days. We really should schedule a screening for colonoscopy.  Please consider this.Marland Kitchen.   Keeping you healthy  Get these tests  Blood pressure- Have your blood pressure checked once a year by your healthcare provider.  Normal blood pressure is 120/80  Weight- Have your body mass index (BMI) calculated to screen for obesity.  BMI is a measure of body fat based on height and weight. You can also calculate your own BMI at [email or web address]/.  Cholesterol- Have your cholesterol checked every year.  Diabetes- Have your blood sugar checked regularly if you have high blood pressure, high cholesterol, have a family history of diabetes or if you are overweight.  Screening for Colon Cancer- Colonoscopy starting at age 51.  Screening may begin sooner depending on your family history and other health conditions. Follow up colonoscopy as directed by your Gastroenterologist.  Screening for Prostate Cancer- Both blood work (PSA) and a rectal exam help screen for Prostate Cancer.  Screening begins at age 640 with African-American men and at age 51 with Caucasian men.  Screening may begin sooner depending on your family history.  Take these medicines  Aspirin- One aspirin daily can help prevent Heart disease and Stroke.  Flu shot- Every fall.  Tetanus- Every 10 years.  Zostavax- Once after the age of 51 to prevent Shingles.  Pneumonia shot- Once after the age of 51; if you are younger than 5965, ask your healthcare provider if you need a Pneumonia shot.  Take these steps  Don't smoke- If you do smoke, talk to your doctor about quitting.  For tips on how to quit, go to www.smokefree.gov or call 1-800-QUIT-NOW.  Be physically active- Exercise 5 days a week for at least 30 minutes.  If you are not already physically active start slow and gradually work up to 30 minutes of moderate physical activity.  Examples of moderate activity include walking  briskly, mowing the yard, dancing, swimming, bicycling, etc.  Eat a healthy diet- Eat a variety of healthy food such as fruits, vegetables, low fat milk, low fat cheese, yogurt, lean meant, poultry, fish, beans, tofu, etc. For more information go to www.thenutritionsource.org  Drink alcohol in moderation- Limit alcohol intake to less than two drinks a day. Never drink and drive.  Dentist- Brush and floss twice daily; visit your dentist twice a year.  Depression- Your emotional health is as important as your physical health. If you're feeling down, or losing interest in things you would normally enjoy please talk to your healthcare provider.  Eye exam- Visit your eye doctor every year.  Safe sex- If you may be exposed to a sexually transmitted infection, use a condom.  Seat belts- Seat belts can save your life; always wear one.  Smoke/Carbon Monoxide detectors- These detectors need to be installed on the appropriate level of your home.  Replace batteries at least once a year.  Skin cancer- When out in the sun, cover up and use sunscreen 15 SPF or higher.  Violence- If anyone is threatening you, please tell your healthcare provider. Living Will/ Health care power of attorney- Speak with your healthcare provider and family.

## 2015-05-03 LAB — COMPLETE METABOLIC PANEL WITH GFR
ALT: 16 U/L (ref 9–46)
AST: 21 U/L (ref 10–35)
Albumin: 4.4 g/dL (ref 3.6–5.1)
Alkaline Phosphatase: 48 U/L (ref 40–115)
BUN: 13 mg/dL (ref 7–25)
CO2: 25 mmol/L (ref 20–31)
CREATININE: 0.93 mg/dL (ref 0.70–1.33)
Calcium: 9.4 mg/dL (ref 8.6–10.3)
Chloride: 102 mmol/L (ref 98–110)
GFR, Est Non African American: >89 mL/min (ref >=60)
Glucose, Bld: 80 mg/dL (ref 65–99)
Potassium: 4 mmol/L (ref 3.5–5.3)
Sodium: 139 mmol/L (ref 135–146)
Total Bilirubin: 0.9 mg/dL (ref 0.2–1.2)
Total Protein: 6.6 g/dL (ref 6.1–8.1)

## 2015-05-03 LAB — CBC
HCT: 46.8 % (ref 39.0–52.0)
Hemoglobin: 16.2 g/dL (ref 13.0–17.0)
MCH: 29.3 pg (ref 26.0–34.0)
MCHC: 34.6 g/dL (ref 30.0–36.0)
MCV: 84.8 fL (ref 78.0–100.0)
MPV: 8.7 fL (ref 8.6–12.4)
Platelets: 287 10*3/uL (ref 150–400)
RBC: 5.52 MIL/uL (ref 4.22–5.81)
RDW: 13.1 % (ref 11.5–15.5)
WBC: 7.6 10*3/uL (ref 4.0–10.5)

## 2015-05-03 LAB — LIPID PANEL
Cholesterol: 137 mg/dL (ref 125–200)
HDL: 43 mg/dL (ref >=40)
LDL Cholesterol: 79 mg/dL (ref <130)
Total CHOL/HDL Ratio: 3.2 Ratio (ref <=5.0)
Triglycerides: 73 mg/dL (ref <150)
VLDL: 15 mg/dL (ref <30)

## 2015-05-03 LAB — TSH: TSH: 3.727 u[IU]/mL (ref 0.350–4.500)

## 2015-05-04 LAB — PSA: PSA: 1.27 ng/mL (ref <=4.00)

## 2015-05-11 ENCOUNTER — Encounter: Payer: Self-pay | Admitting: *Deleted

## 2015-06-19 IMAGING — CR DG ABDOMEN 1V
1 series · 1 of 1 positions shown · non-contrast
Comparison: CT abdomen and pelvis 12/09/2009.

CLINICAL DATA: Severe abdominal pain on the right.

EXAM:
ABDOMEN - 1 VIEW

[AP]
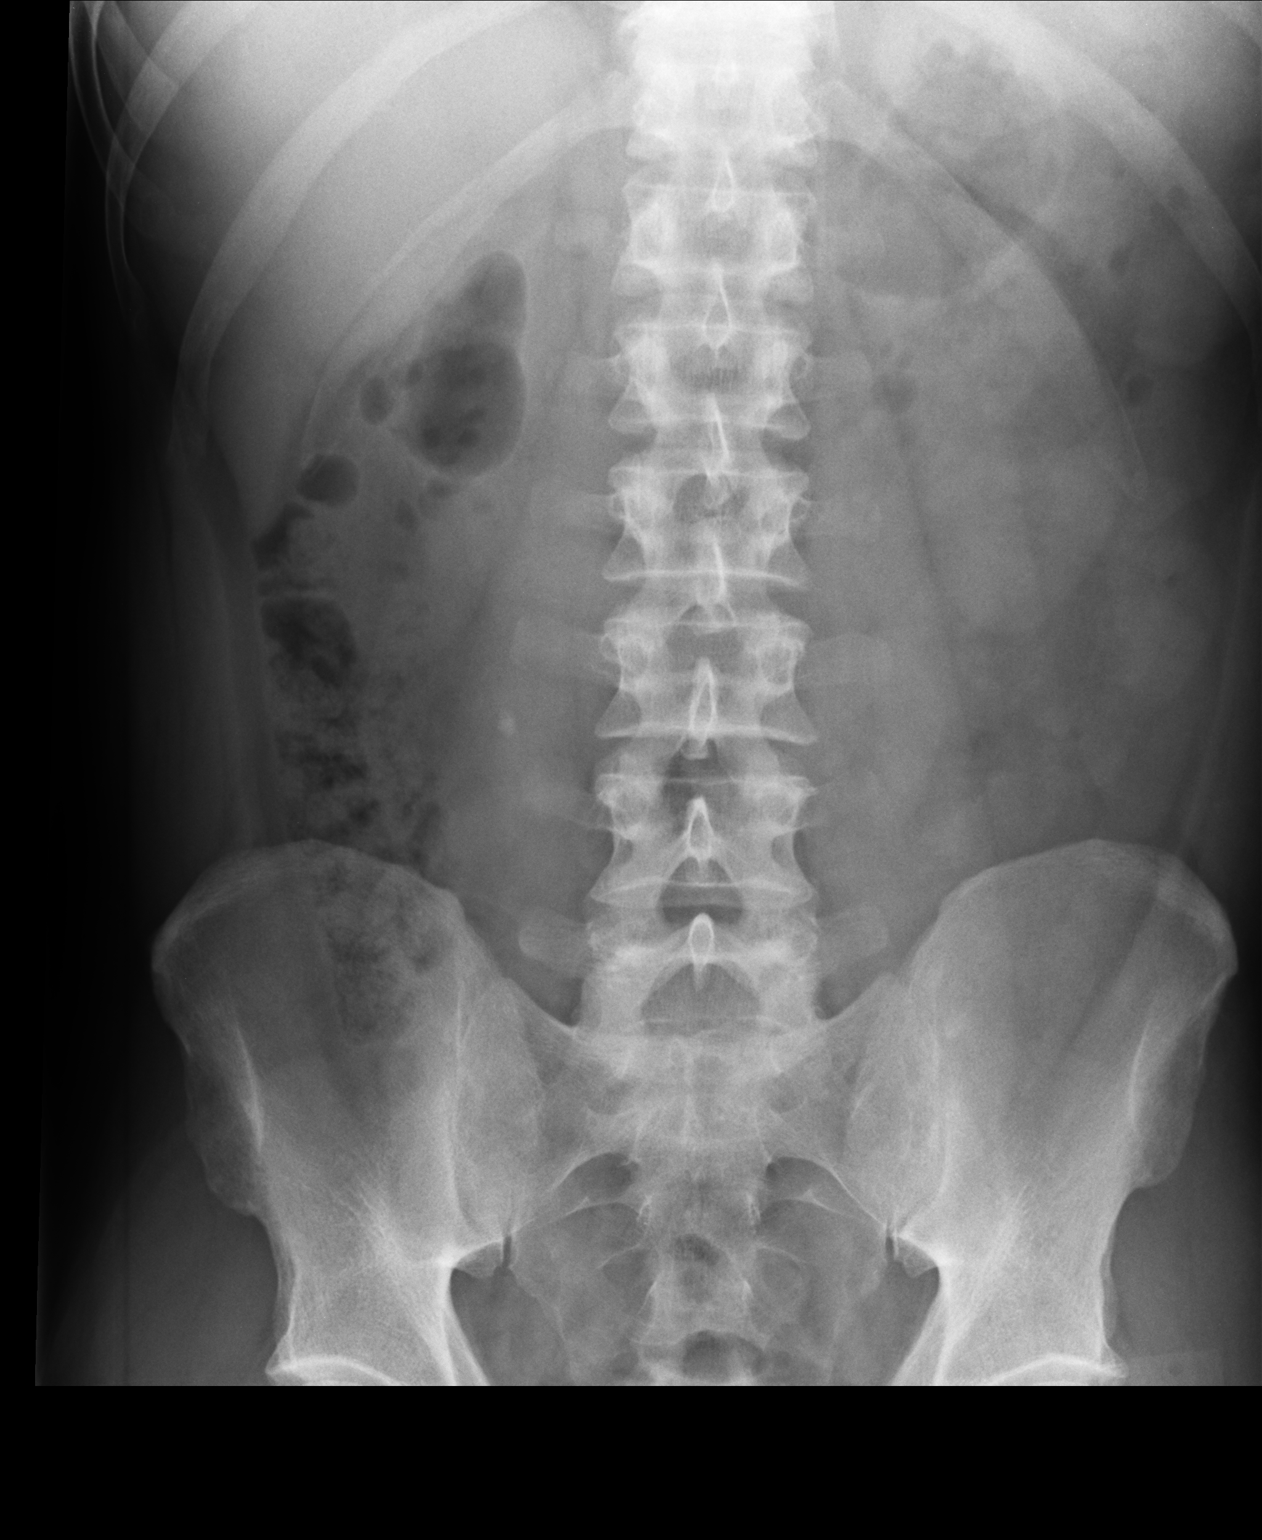

[1 of 1 positions shown; findings below may reference images not displayed]

FINDINGS: There is a 0.7 cm in diameter radiopaque density between the left L3
and L4 process consistent with a ureteral stone. No other abnormal
calcifications are seen. The bowel gas pattern is normal. No bony
abnormality is identified.
IMPRESSION: Findings consistent with a mid left ureteral stone.

## 2015-11-24 IMAGING — CR DG ABDOMEN 2V
2 series · 2 of 2 positions shown · non-contrast
Comparison: 11/27/2013.

CLINICAL DATA: Right flank pain. History of kidney stone who
presents to the [REDACTED] complaining of pain
associated to kidney stone. Patient reports nausea, vomiting,
chills, and diaphoresis onset three hours ago. However, patient
reports mild discomfort felt throughout the day that climaxed this
evening. Patient reports similar pain felt with his last occurrence,
but his current pain is more severe. Patient has taken Oxycodone
which historically provides relief; today his dose did not touch his
pain.

EXAM:
ABDOMEN - 2 VIEW

[AP (1 of 2)]
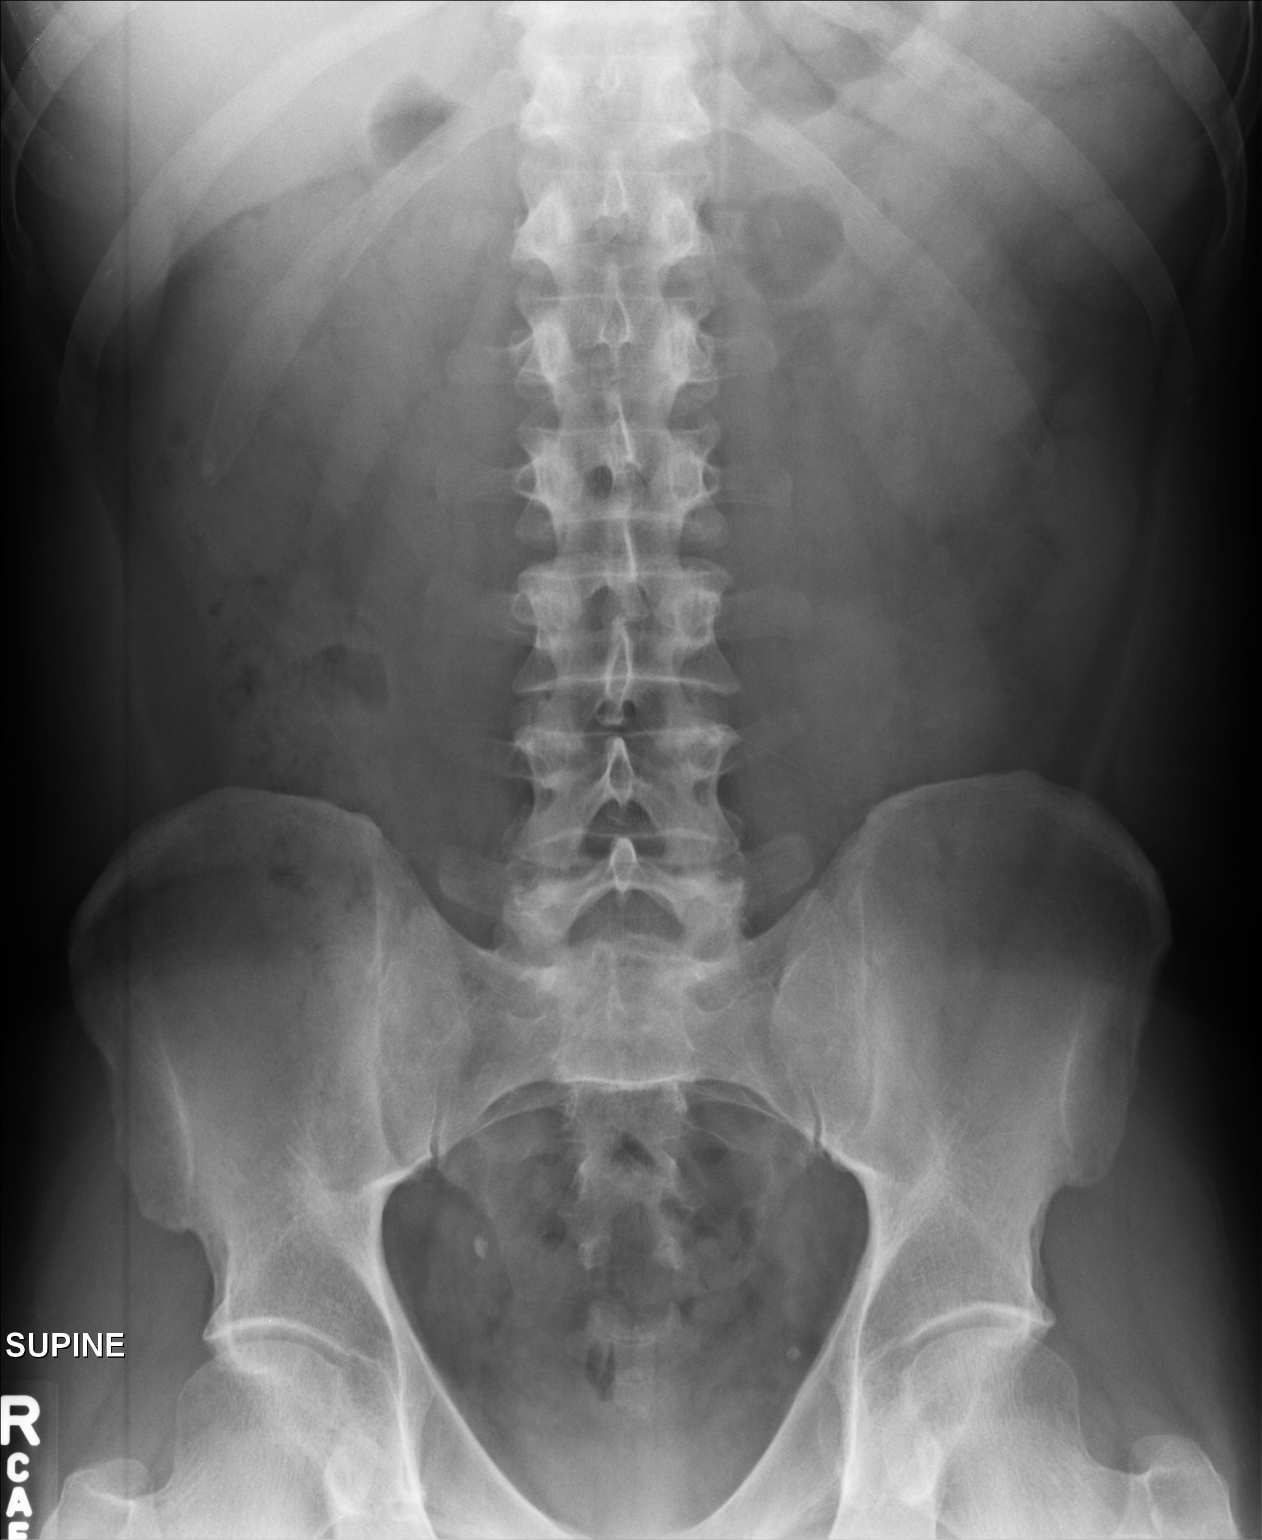

[AP (2 of 2)]
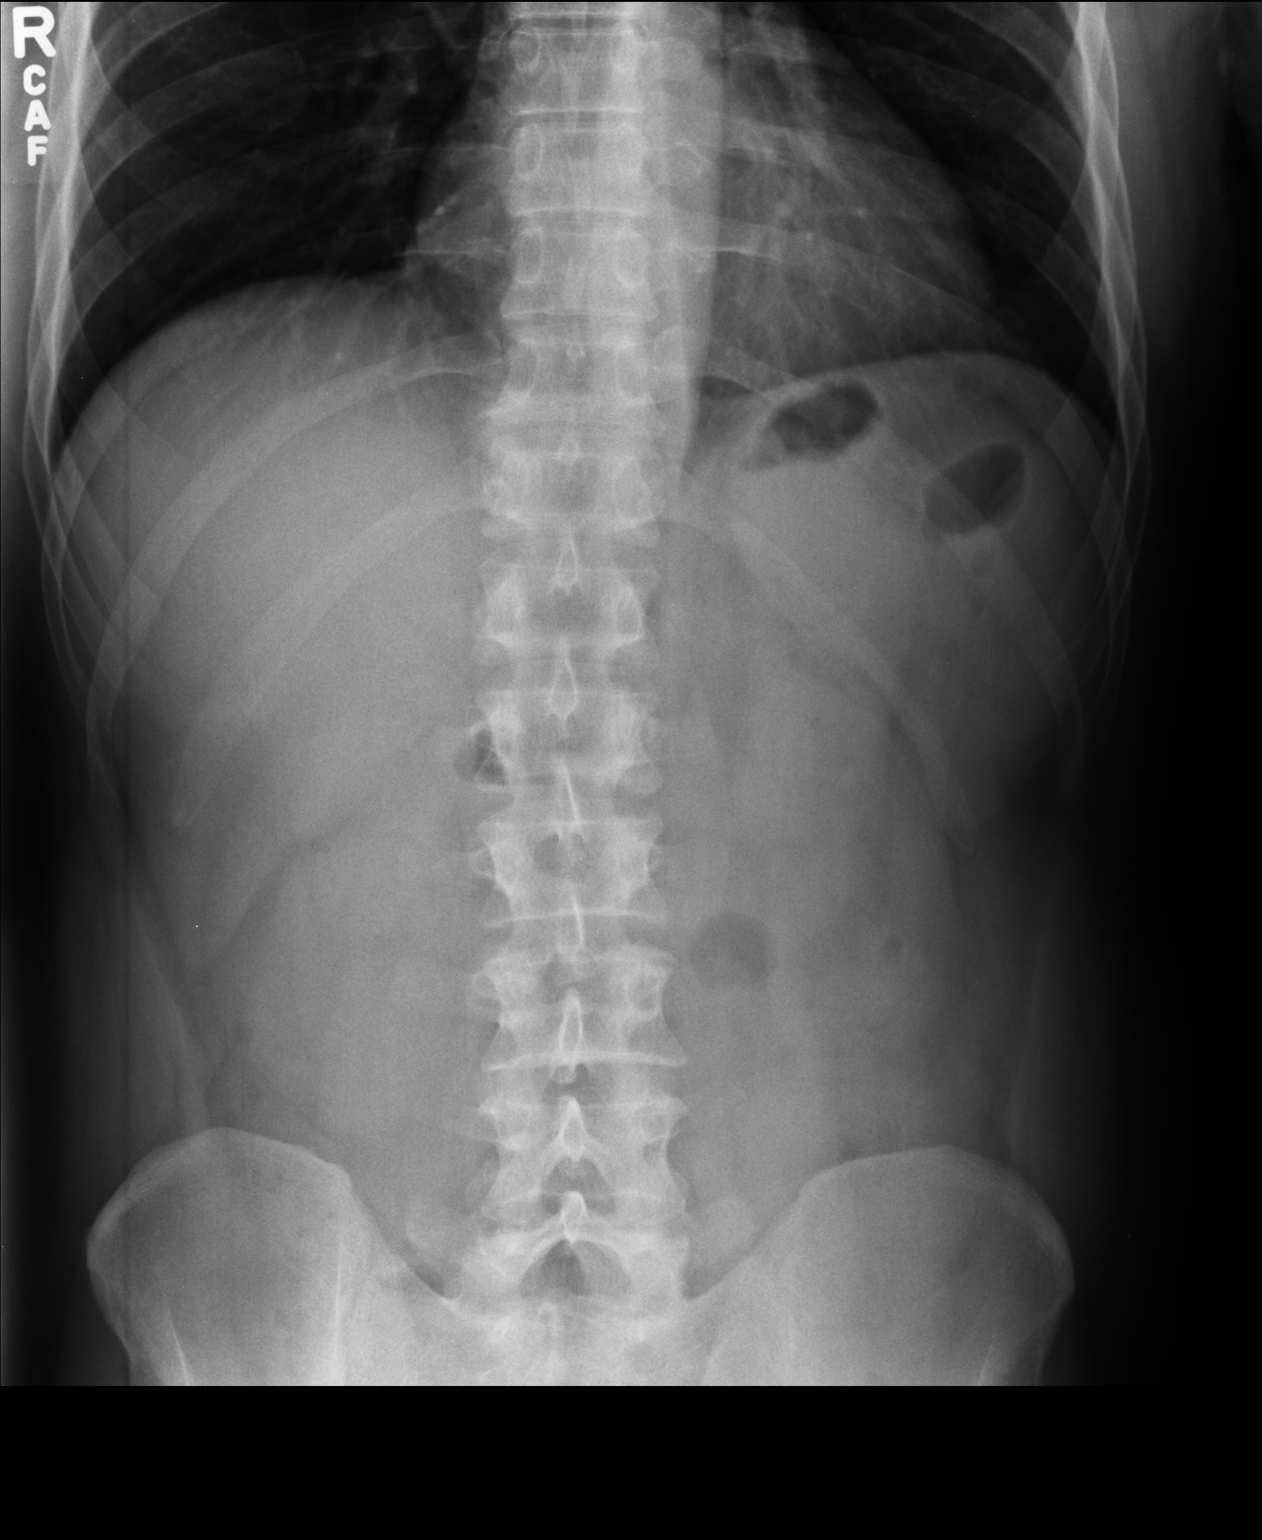

[2 of 2 positions shown; findings below may reference images not displayed]

FINDINGS: Normal bowel gas pattern. Left pelvic phlebolith. 6 mm similar
triangular-shaped and elongated calcification in the mid right
pelvis. No renal calculi are seen. Normal appearing bones.
IMPRESSION: 6 mm possible distal right ureteral calculus.

## 2016-01-03 ENCOUNTER — Ambulatory Visit (INDEPENDENT_AMBULATORY_CARE_PROVIDER_SITE_OTHER): Payer: Commercial Managed Care - PPO | Admitting: Physician Assistant

## 2016-01-03 ENCOUNTER — Encounter: Payer: Self-pay | Admitting: Physician Assistant

## 2016-01-03 VITALS — BP 138/82 | HR 64 | Temp 97.9°F | Resp 16 | Ht 68.5 in | Wt 161.8 lb

## 2016-01-03 DIAGNOSIS — L237 Allergic contact dermatitis due to plants, except food: Secondary | ICD-10-CM | POA: Diagnosis not present

## 2016-01-03 MED ORDER — TRIAMCINOLONE ACETONIDE 0.1 % EX CREA: 1.0000 "application " | TOPICAL_CREAM | Freq: Two times a day (BID) | CUTANEOUS | 0 refills | Status: DC

## 2016-01-03 MED ORDER — PREDNISONE 20 MG PO TABS: ORAL_TABLET | ORAL | 0 refills | Status: AC

## 2016-01-03 NOTE — Progress Notes (Signed)
[facility] [address], [place] [address] [phone]  By signing my name below I, Raven Small, attest that this documentation has been prepared under the direction and in the presence of Trena PlattStephanie English PA. Electonically Signed. Raven Small, Scribe 01/03/2016 at 8:19 AM  Date:  01/03/2016   Name:  Jeremy DawleyJames W Hunter   DOB:  09/01/1965   MRN:  [id number]  PCP:  No PCP Per Patient    History of Present Illness:  Jeremy Hunter is a 50 y.o. male patient who [facility] for erythematous, mildly itchy rash along bilateral arms and legs that started 10 days ago. Pt suspects he has poison ivy. He has been outside working in the, pulling weeds. He notes rash on penis with some swelling but states this has improved, no dysuria. He also reports leg swelling with rash. Pt was on cruise last week and was given aloe vera lotions by the ship nurse. He's also tried calamine lotions without relief. He denies fever, chills, and sore throat.   There are no active problems to display for this patient.   Past Medical History:  Diagnosis Date   Nephrolithiasis    right ureteral stone    Past Surgical History:  Procedure Laterality Date   VASECTOMY      Social History  Substance Use Topics   Smoking status: Never Smoker   Smokeless tobacco: Never Used   Alcohol use No    Family History  Problem Relation Age of Onset   Heart disease Mother    Heart disease Father     No Known Allergies  Medication list has been reviewed and updated.  Current Outpatient Prescriptions on File Prior to Visit  Medication Sig Dispense Refill   acetaminophen (TYLENOL) 500 MG tablet Take 1,000 mg by mouth every 6 (six) hours as needed for moderate pain or headache.     aspirin 325 MG EC tablet Take 325 mg by mouth every morning.     ketorolac (TORADOL) 10 MG tablet Take 1 tablet (10 mg total) by mouth every 6 (six) hours as needed. (Patient not taking: Reported on  05/02/2015) 20 tablet 0   ondansetron (ZOFRAN-ODT) 8 MG disintegrating tablet Take 1 tablet (8 mg total) by mouth every 8 (eight) hours as needed for nausea. (Patient not taking: Reported on 05/02/2015) 21 tablet 0   oxyCODONE-acetaminophen (ROXICET) 5-325 MG per tablet Take 1 tablet by mouth every 6 (six) hours as needed for severe pain. (Patient not taking: Reported on 05/02/2015) 30 tablet 0   tamsulosin (FLOMAX) 0.4 MG CAPS capsule Take 1 capsule (0.4 mg total) by mouth daily. (Patient not taking: Reported on 05/02/2015) 30 capsule 0   No current facility-administered medications on file prior to visit.     Review of Systems  Constitutional: Negative for chills and fever.  HENT: Negative for sore throat.   Skin: Positive for itching ( mild) and rash.   ROS unremarkable unless otherwise specified.  Physical Examination: BP 138/82  Ideal Body Weight: @FLOWAMB ([id number])@([id number])@  Physical Exam  Constitutional: He is oriented to person, place, and time. He appears well-developed and well-nourished. No distress.  HENT:  Head: Normocephalic and atraumatic.  Erythema right soft palette.  Eyes: Conjunctivae and EOM are normal. Pupils are equal, round, and reactive to light.  Cardiovascular: Normal rate, regular rhythm and normal heart sounds.   Pulmonary/Chest: Effort normal. No respiratory distress.  Neurological: He is alert and oriented to person, place, and time.  Skin: Skin is warm and dry. He is not diaphoretic.  Right anterior forearm with erythematous raised patch with satellite blistering. Scant lesions along dorsum of hands and 1st DIP. Non tenderness. No drainage. Erythematous patch right groin. Few scant erythematous patches consistent with ones on upper extremities along thighs bilaterally.  Psychiatric: He has a normal mood and affect. His behavior is normal.     Assessment and Plan: [name] is a 50 y.o. male who is here today for rash. Hx and physical exam appear  consistent with poison ivy.  Diffused rash, will place on prednisone.  Topical cream given symptomsatically rtc if symptoms do not improve.   Poison ivy - Plan: triamcinolone cream (KENALOG) 0.1 %, predniSONE (DELTASONE) 20 MG tablet   Trena Platt, PA-C [facility] 01/03/2016 8:19 AM

## 2016-01-03 NOTE — Patient Instructions (Addendum)
Poison Ivy Poison ivy is a inflammation of the skin (contact dermatitis) caused by touching the allergens on the leaves of the ivy plant following previous exposure to the plant. The rash usually appears 48 hours after exposure. The rash is usually bumps (papules) or blisters (vesicles) in a linear pattern. Depending on your own sensitivity, the rash may simply cause redness and itching, or it may also progress to blisters which may break open. These must be well cared for to prevent secondary bacterial (germ) infection, followed by scarring. Keep any open areas dry, clean, dressed, and covered with an antibacterial ointment if needed. The eyes may also get puffy. The puffiness is worst in the morning and gets better as the day progresses. This dermatitis usually heals without scarring, within 2 to 3 weeks without treatment. HOME CARE INSTRUCTIONS  Thoroughly wash with soap and water as soon as you have been exposed to poison ivy. You have about one half hour to remove the plant resin before it will cause the rash. This washing will destroy the oil or antigen on the skin that is causing, or will cause, the rash. Be sure to wash under your fingernails as any plant resin there will continue to spread the rash. Do not rub skin vigorously when washing affected area. Poison ivy cannot spread if no oil from the plant remains on your body. A rash that has progressed to weeping sores will not spread the rash unless you have not washed thoroughly. It is also important to wash any clothes you have been wearing as these may carry active allergens. The rash will return if you wear the unwashed clothing, even several days later. Avoidance of the plant in the future is the best measure. Poison ivy plant can be recognized by the number of leaves. Generally, poison ivy has three leaves with flowering branches on a single stem. Diphenhydramine may be purchased over the counter and used as needed for itching. Do not drive with  this medication if it makes you drowsy.Ask your caregiver about medication for children. SEEK MEDICAL CARE IF:  Open sores develop.  Redness spreads beyond area of rash.  You notice purulent (pus-like) discharge.  You have increased pain.  Other signs of infection develop (such as fever).   This information is not intended to replace advice given to you by your health care provider. Make sure you discuss any questions you have with your health care provider.   Document Released: 05/08/2000 Document Revised: 08/03/2011 Document Reviewed: 10/17/2014 Elsevier Interactive Patient Education 2016 Elsevier Inc.     IF you received an x-ray today, you will receive an invoice from [facility]. Please contact [facility] at [phone] with questions or concerns regarding your invoice.   IF you received labwork today, you will receive an invoice from [facility]. Please contact [facility] at [phone] with questions or concerns regarding your invoice.   Our billing staff will not be able to assist you with questions regarding bills from these companies.  You will be contacted with the lab results as soon as they are available. The fastest way to get your results is to activate your My Chart account. Instructions are located on the last page of this paperwork. If you have not heard from us regarding the results in 2 weeks, please contact this office.      

## 2016-02-19 ENCOUNTER — Telehealth: Payer: Self-pay

## 2016-02-19 DIAGNOSIS — L237 Allergic contact dermatitis due to plants, except food: Secondary | ICD-10-CM

## 2016-02-19 NOTE — Telephone Encounter (Signed)
PATIENT STATES HE SAW STEPHANIE ENGLISH LAST MONTH FOR POISON IVY. SHE PRESCRIBED HIM TRIAMCINOLONE CREAM 0.1%. IT CAME BACK AGAIN A FEW DAYS AGO BUT NOT AS BAD. HE WOULD LIKE TO GET A REFILL ON IT PLEASE. BEST PHONE [phone] (CELL)  PHARMACY CHOICE IS [facility] IN [place], [place].  MBC

## 2016-02-20 ENCOUNTER — Ambulatory Visit (INDEPENDENT_AMBULATORY_CARE_PROVIDER_SITE_OTHER): Payer: Commercial Managed Care - PPO | Admitting: Physician Assistant

## 2016-02-20 DIAGNOSIS — L237 Allergic contact dermatitis due to plants, except food: Secondary | ICD-10-CM | POA: Diagnosis not present

## 2016-02-20 MED ORDER — PREDNISONE 20 MG PO TABS: ORAL_TABLET | ORAL | 0 refills | Status: AC

## 2016-02-20 MED ORDER — TRIAMCINOLONE ACETONIDE 0.1 % EX CREA: 1.0000 "application " | TOPICAL_CREAM | Freq: Two times a day (BID) | CUTANEOUS | 0 refills | Status: DC

## 2016-02-20 NOTE — Progress Notes (Signed)
[facility] [address], [place] [address] [phone]  By signing my name below, I, Jeremy Hunter, attest that this documentation has been prepared under the direction and in the presence of Jeremy English, PA-C. Electronically Signed: Arvilla MarketMesha Hunter, Medical Scribe. 02/20/16. 4:34 PM.  Date:  02/20/2016   Name:  Jeremy Hunter   DOB:  08/13/1965   MRN:  [id number]  PCP:  No PCP Per Patient   Chief Complaint  Patient presents with   Poison Ivy    x1.5weeks     History of Present Illness:  Jeremy Hunter Cosner is a 50 y.o. male patient who [facility] complaining of itchy rash on both of his forearms onset 1.5 weeks. Pt was clearing bushes without sleeves. Pt suspects the rash is poison ivy and mentions it spread to both of his ankles, and right hip. Pt has been using triamcinolone cream with some relieif to his symptoms. Pt mentions the last time he came in for poison ivy (01/03/16), the prednisone taper prescribed to him really helped his symptoms and he didn't have to use the triamcinolone cream as often.  Immunizations: Pt deferred the flu shot.  There are no active problems to display for this patient.   Past Medical History:  Diagnosis Date   Nephrolithiasis    right ureteral stone    Past Surgical History:  Procedure Laterality Date   VASECTOMY      Social History  Substance Use Topics   Smoking status: Never Smoker   Smokeless tobacco: Never Used   Alcohol use No    Family History  Problem Relation Age of Onset   Heart disease Mother    Heart disease Father     No Known Allergies  Medication list has been reviewed and updated.  Current Outpatient Prescriptions on File Prior to Visit  Medication Sig Dispense Refill   triamcinolone cream (KENALOG) 0.1 % Apply 1 application topically 2 (two) times daily. 30 g 0   oxyCODONE-acetaminophen (ROXICET) 5-325 MG per tablet Take 1 tablet by mouth every 6 (six) hours as  needed for severe pain. (Patient not taking: Reported on 02/20/2016) 30 tablet 0   No current facility-administered medications on file prior to visit.     Review of Systems  Skin: Positive for itching and rash.   Physical Examination: BP 124/80 (BP Location: Left Arm, Patient Position: Sitting, Cuff Size: Large)    Pulse (!) 54    Temp 98.3 F (36.8 C) (Oral)    Resp 17    Ht 5' 8.5" (1.74 m)    Wt 152 lb (68.9 kg)    SpO2 99%    BMI 22.78 kg/m  Ideal Body Weight: @FLOWAMB ([phone])@[phone])@  Physical Exam  Constitutional: He is oriented to person, place, and time. He appears well-developed and well-nourished. No distress.  HENT:  Head: Normocephalic and atraumatic.  Eyes: Conjunctivae and EOM are normal. Pupils are equal, round, and reactive to light.  Cardiovascular: Normal rate.   Pulmonary/Chest: Effort normal. No respiratory distress.  Neurological: He is alert and oriented to person, place, and time.  Skin: Skin is warm and dry. He is not diaphoretic.  Bilateral anterior forearms with erythematous raised patches.  Scant erythematous patch consistent with ones on upper extremities along right groin, and ankles bilaterally.  No tenderness. No drainage.  Psychiatric: He has a normal mood and affect. His behavior is normal.    Assessment and Plan: Jeremy Hunter is a 50 y.o.  male who is here today For chief complaint of rash. This looks consistent with poison ivy. I have advised a prednisone 6 day taper. Also given a refill of the triamcinolone acetonide. Return to clinic as needed. Poison ivy - Plan: predniSONE (DELTASONE) 20 MG tablet, triamcinolone cream (KENALOG) 0.1 %  Trena Platt, PA-C [facility] 9/30/20179:59 PM I personally performed the services described in this documentation, which was scribed in my presence. The recorded information has been reviewed and is accurate.

## 2016-02-20 NOTE — Patient Instructions (Addendum)
     IF you received an x-ray today, you will receive an invoice from [facility]. Please contact [facility] at [phone] with questions or concerns regarding your invoice.   IF you received labwork today, you will receive an invoice from [facility]. Please contact [facility] at [phone] with questions or concerns regarding your invoice.   Our billing staff will not be able to assist you with questions regarding bills from these companies.  You will be contacted with the lab results as soon as they are available. The fastest way to get your results is to activate your My Chart account. Instructions are located on the last page of this paperwork. If you have not heard from us regarding the results in 2 weeks, please contact this office.     Poison Ivy Poison ivy is a inflammation of the skin (contact dermatitis) caused by touching the allergens on the leaves of the ivy plant following previous exposure to the plant. The rash usually appears 48 hours after exposure. The rash is usually bumps (papules) or blisters (vesicles) in a linear pattern. Depending on your own sensitivity, the rash may simply cause redness and itching, or it may also progress to blisters which may break open. These must be well cared for to prevent secondary bacterial (germ) infection, followed by scarring. Keep any open areas dry, clean, dressed, and covered with an antibacterial ointment if needed. The eyes may also get puffy. The puffiness is worst in the morning and gets better as the day progresses. This dermatitis usually heals without scarring, within 2 to 3 weeks without treatment. HOME CARE INSTRUCTIONS  Thoroughly wash with soap and water as soon as you have been exposed to poison ivy. You have about one half hour to remove the plant resin before it will cause the rash. This washing will destroy the oil or antigen on the skin that is causing, or will cause, the rash.  Be sure to wash under your fingernails as any plant resin there will continue to spread the rash. Do not rub skin vigorously when washing affected area. Poison ivy cannot spread if no oil from the plant remains on your body. A rash that has progressed to weeping sores will not spread the rash unless you have not washed thoroughly. It is also important to wash any clothes you have been wearing as these may carry active allergens. The rash will return if you wear the unwashed clothing, even several days later. Avoidance of the plant in the future is the best measure. Poison ivy plant can be recognized by the number of leaves. Generally, poison ivy has three leaves with flowering branches on a single stem. Diphenhydramine may be purchased over the counter and used as needed for itching. Do not drive with this medication if it makes you drowsy.Ask your caregiver about medication for children. SEEK MEDICAL CARE IF:  Open sores develop.  Redness spreads beyond area of rash.  You notice purulent (pus-like) discharge.  You have increased pain.  Other signs of infection develop (such as fever).   This information is not intended to replace advice given to you by your health care provider. Make sure you discuss any questions you have with your health care provider.   Document Released: 05/08/2000 Document Revised: 08/03/2011 Document Reviewed: 10/17/2014 Elsevier Interactive Patient Education 2016 Elsevier Inc.  

## 2016-02-24 MED ORDER — TRIAMCINOLONE ACETONIDE 0.1 % EX CREA: 1.0000 "application " | TOPICAL_CREAM | Freq: Two times a day (BID) | CUTANEOUS | 0 refills | Status: DC

## 2016-02-24 NOTE — Telephone Encounter (Signed)
Sent in 1 RF and advised pt on Vm to RTC if worsens or does not resolve.

## 2016-11-23 ENCOUNTER — Ambulatory Visit (INDEPENDENT_AMBULATORY_CARE_PROVIDER_SITE_OTHER): Payer: Commercial Managed Care - PPO | Admitting: Physician Assistant

## 2016-11-23 ENCOUNTER — Encounter: Payer: Self-pay | Admitting: Physician Assistant

## 2016-11-23 VITALS — BP 112/75 | HR 71 | Temp 98.1°F | Resp 16 | Ht 68.5 in | Wt 154.8 lb

## 2016-11-23 DIAGNOSIS — J029 Acute pharyngitis, unspecified: Secondary | ICD-10-CM

## 2016-11-23 LAB — POCT RAPID STREP A (OFFICE): Rapid Strep A Screen: NEGATIVE

## 2016-11-23 MED ORDER — LIDOCAINE VISCOUS 2 % MT SOLN: 5.0000 mL | OROMUCOSAL | 0 refills | Status: DC | PRN

## 2016-11-23 MED ORDER — AMOXICILLIN 875 MG PO TABS: 875.0000 mg | ORAL_TABLET | Freq: Two times a day (BID) | ORAL | 0 refills | Status: AC

## 2016-11-23 NOTE — Patient Instructions (Signed)
     IF you received an x-ray today, you will receive an invoice from [facility]. Please contact [facility] at [phone] with questions or concerns regarding your invoice.   IF you received labwork today, you will receive an invoice from [facility]. Please contact [facility] at [phone] with questions or concerns regarding your invoice.   Our billing staff will not be able to assist you with questions regarding bills from these companies.  You will be contacted with the lab results as soon as they are available. The fastest way to get your results is to activate your My Chart account. Instructions are located on the last page of this paperwork. If you have not heard from us regarding the results in 2 weeks, please contact this office.     

## 2016-11-23 NOTE — Progress Notes (Signed)
11/23/2016 5:25 PM   DOB: 10-27-1965 / MRN: [id number]  SUBJECTIVE:  Jeremy Hunter is a 51 y.o. male presenting for sore throat that started about 24 hours ago.  Associates fatigue and subjective fever. Also complains of a stye in his left eye.  Denies cough and nasal congestion. Tells me a co-worker had strep last week.   He has No Known Allergies.   He  has a past medical history of Nephrolithiasis.    He  reports that he has never smoked. He has never used smokeless tobacco. He reports that he does not drink alcohol or use drugs. He  has no sexual activity history on file. The patient  has a past surgical history that includes Vasectomy.  His family history includes Heart disease in his father and mother.  Review of Systems  Constitutional: Negative for diaphoresis.  Eyes: Negative.   Respiratory: Negative for cough, hemoptysis, sputum production, shortness of breath and wheezing.   Cardiovascular: Negative for chest pain, orthopnea and leg swelling.  Gastrointestinal: Negative for nausea.  Neurological: Negative for dizziness, sensory change, speech change, focal weakness and headaches.    The problem list and medications were reviewed and updated by myself where necessary and exist elsewhere in the encounter.   OBJECTIVE:  BP 112/75 (BP Location: Right Arm, Patient Position: Sitting, Cuff Size: Normal)   Pulse 71   Temp 98.1 F (36.7 C) (Oral)   Resp 16   Ht 5' 8.5" (1.74 m)   Wt 154 lb 12.8 oz (70.2 kg)   SpO2 98%   BMI 23.19 kg/m   Physical Exam  Constitutional: He is oriented to person, place, and time. He appears well-developed. He is active and cooperative.  Non-toxic appearance.  HENT:  Right Ear: Hearing, tympanic membrane, external ear and ear canal normal.  Left Ear: Hearing, tympanic membrane, external ear and ear canal normal.  Nose: Nose normal. Right sinus exhibits no maxillary sinus tenderness and no frontal sinus tenderness. Left sinus exhibits no  maxillary sinus tenderness and no frontal sinus tenderness.  Mouth/Throat: Uvula is midline, oropharynx is clear and moist and mucous membranes are normal. No oropharyngeal exudate, posterior oropharyngeal edema or tonsillar abscesses.    Eyes: Conjunctivae and EOM are normal. Pupils are equal, round, and reactive to light.  Cardiovascular: Normal rate.   Pulmonary/Chest: Effort normal. No stridor. No tachypnea. No respiratory distress. He has no wheezes. He has no rales.  Lymphadenopathy:       Head (right side): No submandibular and no tonsillar adenopathy present.       Head (left side): No submandibular and no tonsillar adenopathy present.    He has no cervical adenopathy.  Neurological: He is alert and oriented to person, place, and time. He has normal strength and normal reflexes. He is not disoriented. No cranial nerve deficit or sensory deficit. He exhibits normal muscle tone. Coordination and gait normal.  Skin: Skin is warm and dry. He is not diaphoretic. No pallor.  Psychiatric: His behavior is normal.  Vitals reviewed.   No results found for this or any previous visit (from the past 72 hour(s)).  No results found.  ASSESSMENT AND PLAN:  Jeremy Hunter was seen today for sore throat.  Diagnoses and all orders for this visit:  Sore throat: Isolated vesicle pointing towards hand foot and mouth or other viral etiology.  Patient prefers to treat for strep given previous exposure at work and I agree it is resonable to cover for that etiology.  Will call once strep results.  -     POCT rapid strep A -     Culture, Group A Strep -     amoxicillin (AMOXIL) 875 MG tablet; Take 1 tablet (875 mg total) by mouth 2 (two) times daily. -     lidocaine (XYLOCAINE) 2 % solution; Use as directed 5-10 mLs in the mouth or throat as needed for mouth pain.    The patient is advised to call or return to clinic if he does not see an improvement in symptoms, or to seek the care of the closest emergency  department if he worsens with the above plan.   Deliah BostonMichael Clark, MHS, PA-C [facility] 11/23/2016 5:25 PM

## 2016-11-26 LAB — CULTURE, GROUP A STREP: Strep A Culture: NEGATIVE

## 2017-03-04 ENCOUNTER — Ambulatory Visit: Payer: Commercial Managed Care - PPO | Admitting: Physician Assistant

## 2017-03-05 ENCOUNTER — Ambulatory Visit (INDEPENDENT_AMBULATORY_CARE_PROVIDER_SITE_OTHER): Payer: Commercial Managed Care - PPO | Admitting: Physician Assistant

## 2017-03-05 ENCOUNTER — Encounter: Payer: Self-pay | Admitting: Physician Assistant

## 2017-03-05 VITALS — BP 132/80 | HR 79 | Resp 16 | Ht 68.5 in | Wt 164.4 lb

## 2017-03-05 DIAGNOSIS — L219 Seborrheic dermatitis, unspecified: Secondary | ICD-10-CM

## 2017-03-05 MED ORDER — KETOCONAZOLE 2 % EX CREA: 1.0000 "application " | TOPICAL_CREAM | Freq: Every day | CUTANEOUS | 1 refills | Status: AC

## 2017-03-05 NOTE — Patient Instructions (Signed)
     IF you received an x-ray today, you will receive an invoice from [facility]. Please contact [facility] at [phone] with questions or concerns regarding your invoice.   IF you received labwork today, you will receive an invoice from [facility]. Please contact [facility] at [phone] with questions or concerns regarding your invoice.   Our billing staff will not be able to assist you with questions regarding bills from these companies.  You will be contacted with the lab results as soon as they are available. The fastest way to get your results is to activate your My Chart account. Instructions are located on the last page of this paperwork. If you have not heard from us regarding the results in 2 weeks, please contact this office.     

## 2017-03-05 NOTE — Progress Notes (Signed)
    03/06/2017 8:22 AM   DOB: 15-May-1966 / MRN: [id number]  SUBJECTIVE:  Jeremy Hunter is a 51 y.o. male presenting for refill of ketoconazole that was initially prescribed by derm for seborrhea about the bridge of the nose. Tells me that if he does not use the medication a mildly itchy rash reappears about the area. He has been out of his cream for about two days now and tells me the irritation is returning.   He has No Known Allergies.   He  has a past medical history of Nephrolithiasis.    He  reports that he has never smoked. He has never used smokeless tobacco. He reports that he does not drink alcohol or use drugs. He  has no sexual activity history on file. The patient  has a past surgical history that includes Vasectomy.  His family history includes Heart disease in his father and mother.  Review of Systems  Constitutional: Negative for chills, diaphoresis and fever.  Respiratory: Negative for shortness of breath.   Cardiovascular: Negative for chest pain, orthopnea and leg swelling.  Gastrointestinal: Negative for nausea.  Skin: Negative for rash.  Neurological: Negative for dizziness.    The problem list and medications were reviewed and updated by myself where necessary and exist elsewhere in the encounter.   OBJECTIVE:  BP 132/80 (BP Location: Left Arm, Patient Position: Sitting, Cuff Size: Normal)   Pulse 79   Resp 16   Ht 5' 8.5" (1.74 m)   Wt 164 lb 6.4 oz (74.6 kg)   SpO2 97%   BMI 24.63 kg/m   Physical Exam  Constitutional: He appears well-developed. He is active and cooperative.  Non-toxic appearance.  Cardiovascular: Normal rate.   Pulmonary/Chest: Effort normal. No tachypnea.  Neurological: He is alert.  Skin: Skin is warm and dry. He is not diaphoretic. No pallor.  Vitals reviewed.      No results found for this or any previous visit (from the past 72 hour(s)).  No results found.  ASSESSMENT AND PLAN:  Theon was seen today for  rash.  Diagnoses and all orders for this visit:  Seborrhea: Evaluated by dermatology. Cream refilled.  He has primary care elsewhere.  Will see him back for any urgent needs.  -     ketoconazole (NIZORAL) 2 % cream; Apply 1 application topically daily.    The patient is advised to call or return to clinic if he does not see an improvement in symptoms, or to seek the care of the closest emergency department if he worsens with the above plan.   Deliah Boston, MHS, PA-C [facility] at [facility] 03/06/2017 8:22 AM

## 2017-03-06 DIAGNOSIS — L219 Seborrheic dermatitis, unspecified: Secondary | ICD-10-CM | POA: Insufficient documentation

## 2017-04-19 DIAGNOSIS — Z0000 Encounter for general adult medical examination without abnormal findings: Secondary | ICD-10-CM | POA: Diagnosis not present

## 2017-04-19 DIAGNOSIS — Z1211 Encounter for screening for malignant neoplasm of colon: Secondary | ICD-10-CM | POA: Diagnosis not present

## 2017-04-19 DIAGNOSIS — Z125 Encounter for screening for malignant neoplasm of prostate: Secondary | ICD-10-CM | POA: Diagnosis not present

## 2017-07-19 ENCOUNTER — Ambulatory Visit: Payer: Commercial Managed Care - PPO | Admitting: Family Medicine

## 2017-07-19 ENCOUNTER — Encounter: Payer: Self-pay | Admitting: Family Medicine

## 2017-07-19 ENCOUNTER — Other Ambulatory Visit: Payer: Self-pay

## 2017-07-19 VITALS — BP 126/78 | HR 63 | Temp 97.7°F | Resp 16 | Ht 68.07 in | Wt 160.0 lb

## 2017-07-19 DIAGNOSIS — H0019 Chalazion unspecified eye, unspecified eyelid: Secondary | ICD-10-CM | POA: Diagnosis not present

## 2017-07-19 NOTE — Progress Notes (Signed)
Subjective:  By signing my name below, I, [name], attest that this documentation has been prepared under the direction and in the presence of Meredith StaggersJeffrey Deavion Dobbs, MD. Electronically Signed: [name], Scribe. 07/19/2017 , 6:06 PM .  Patient was seen in Room 10 .   Patient ID: Jeremy Hunter, male    DOB: 07/17/1965, 52 y.o.   MRN: [id number] Chief Complaint  Patient presents with  . Eye Stye    Bilateral eye styes x2 weeks. Has tried antibiotic ointment and hot compresses, not helping.    HPI Jeremy Hunter is a [age] y.o. male Here for possible styes on his eyes bilaterally, for 2 weeks. Of note, he does have history of seborrheic dermatitis of face, treated with Nizoral in Oct 2018 and has been treated by dermatology.   Patient states he initially noticed sore bumps over his eyelids simultaneously, possibly left before right, about 2-3 weeks ago. He's tried hot compresses and OTC antibiotic ointment. He's been applying the ointment about 1-2x a day since it started 2-3 weeks ago. It areas improved but never resolved completely. He was doing hot compresses over the areas once an evening. He mentions having similar issues in the past where he was able to resolve with OTC ointment and home treatment. Although, he did note one time without improvement with home treatments, and had an eye doctor remove them surgically. He denies any drainage from the nodules. He denies wearing contacts. He denies being followed by an ophthalmologist.   Patient Active Problem List   Diagnosis Date Noted  . Seborrhea 03/06/2017   Past Medical History:  Diagnosis Date  . Nephrolithiasis    right ureteral stone   Past Surgical History:  Procedure Laterality Date  . VASECTOMY     No Known Allergies Prior to Admission medications   Medication Sig Start Date End Date Taking? Authorizing Provider  ketoconazole (NIZORAL) 2 % cream Apply 1 application topically daily. 03/05/17   Ofilia Neaslark, Michael L, PA-C    lidocaine (XYLOCAINE) 2 % solution Use as directed 5-10 mLs in the mouth or throat as needed for mouth pain. Patient not taking: Reported on 03/05/2017 11/23/16   Ofilia Neaslark, Michael L, PA-C  oxyCODONE-acetaminophen (ROXICET) 5-325 MG per tablet Take 1 tablet by mouth every 6 (six) hours as needed for severe pain. Patient not taking: Reported on 11/23/2016 04/05/14   Ethelda ChickSmith, Kristi M, MD  triamcinolone cream (KENALOG) 0.1 % Apply 1 application topically 2 (two) times daily. Patient not taking: Reported on 11/23/2016 02/20/16   Trena PlattEnglish, Stephanie D, PA  triamcinolone cream (KENALOG) 0.1 % Apply 1 application topically 2 (two) times daily. Patient not taking: Reported on 11/23/2016 02/24/16   Garnetta BuddyEnglish, Stephanie D, PA   Social History   Socioeconomic History  . Marital status: Married    Spouse name: Not on file  . Number of children: Not on file  . Years of education: Not on file  . Highest education level: Not on file  Social Needs  . Financial resource strain: Not on file  . Food insecurity - worry: Not on file  . Food insecurity - inability: Not on file  . Transportation needs - medical: Not on file  . Transportation needs - non-medical: Not on file  Occupational History  . Not on file  Tobacco Use  . Smoking status: Never Smoker  . Smokeless tobacco: Never Used  Substance and Sexual Activity  . Alcohol use: No    Alcohol/week: 0.0 oz  . Drug  use: No  . Sexual activity: Not on file  Other Topics Concern  . Not on file  Social History Narrative  . Not on file   Review of Systems  Constitutional: Negative for chills, diaphoresis, fatigue and fever.  Eyes: Negative for photophobia, pain, discharge, redness, itching and visual disturbance.       Bumps on eyes (bilaterally)  Neurological: Negative for dizziness, light-headedness and headaches.       Objective:   Physical Exam  Constitutional: He is oriented to person, place, and time. He appears well-developed and well-nourished. No  distress.  HENT:  Head: Normocephalic and atraumatic.  Eyes: EOM are normal. Pupils are equal, round, and reactive to light. Right conjunctiva is not injected. Left conjunctiva is not injected. No scleral icterus.  Right eye: firm nodular area right upper outer eye lid, measuring about 4-4mm Left eye: similar nodular area left upper eye lid, approximately 3mm  There is some slight irritation upper lid margin without crusting- irritation only in the area of the nodule, which is mid aspect of his lid; anterior chambers are clear  Neck: Neck supple.  Cardiovascular: Normal rate.  Pulmonary/Chest: Effort normal. No respiratory distress.  Musculoskeletal: Normal range of motion.  Lymphadenopathy:       Head (right side): No preauricular adenopathy present.       Head (left side): No preauricular adenopathy present.    He has no cervical adenopathy.  Neurological: He is alert and oriented to person, place, and time.  Skin: Skin is warm and dry. No rash noted.  Psychiatric: He has a normal mood and affect. His behavior is normal.  Nursing note and vitals reviewed.   Vitals:   07/19/17 1721  BP: 126/78  Pulse: 63  Resp: 16  Temp: 97.7 F (36.5 C)  SpO2: 99%  Weight: 160 lb (72.6 kg)  Height: 5' 8.07" (1.729 m)       Assessment & Plan:   Jeremy Hunter is a 52 y.o. male Chalazion, unspecified laterality Appears to have bilateral upper lid chalazion right slightly larger than left. No apparent surrounding signs of infection/inflammation. Has been using warm compresses once per day, tried antibiotic ointment.  -Increase warm compresses to 3-4 times per day. If not improving in the next 1 week, call and I can refer him to ophthalmology. RTC precautions if worsening sooner. Handouts given.  No orders of the defined types were placed in this encounter.  Patient Instructions    Eyelid lesions appear to be from chalazion, which are not typically infected. Warm compresses 3-4 times per  day may help at this time, but call me if that is not improving in the next week and I can refer you to ophthalmology. If you see any increased redness of the eyelids or spreading redness around the eye, discharge, or any symptoms inside the eyes, return for recheck sooner.   Chalazion A chalazion is a swelling or lump on the eyelid. It can affect the upper or lower eyelid. What are the causes? This condition may be caused by:  Long-lasting (chronic) inflammation of the eyelid glands.  A blocked oil gland in the eyelid.  What are the signs or symptoms? Symptoms of this condition include:  A swelling on the eyelid. The swelling may spread to areas around the eye.  A hard lump on the eyelid. This lump may make it hard to see out of the eye.  How is this diagnosed? This condition is diagnosed with an examination of the  eye. How is this treated? This condition is treated by applying a warm compress to the eyelid. If the condition does not improve after two days, it may be treated with:  Surgery.  Medicine that is injected into the chalazion by a health care provider.  Medicine that is applied to the eye.  Follow these instructions at home:  Do not touch the chalazion.  Do not try to remove the pus, such as by squeezing the chalazion or sticking it with a pin or needle.  Do not rub your eyes.  Wash your hands often. Dry your hands with a clean towel.  Keep your face, scalp, and eyebrows clean.  Avoid wearing eye makeup.  Apply a warm, moist compress to the eyelid 4-6 times a day for 10-15 minutes at a time. This will help to open any blocked glands and help to reduce redness and swelling.  Apply over-the-counter and prescription medicines only as told by your health care provider.  If the chalazion does not break open (rupture) on its own in a month, return to your health care provider.  Keep all follow-up appointments as told by your health care provider. This is  important. Contact a health care provider if:  Your eyelid has not improved in 4 weeks.  Your eyelid is getting worse.  You have a fever.  The chalazion does not rupture on its own with home treatment in a month. Get help right away if:  You have pain in your eye.  Your vision changes.  The chalazion becomes painful or red  The chalazion gets bigger. This information is not intended to replace advice given to you by your health care provider. Make sure you discuss any questions you have with your health care provider. Document Released: 05/08/2000 Document Revised: 10/17/2015 Document Reviewed: 09/03/2014 Elsevier Interactive Patient Education  2018 [employer].      IF you received an x-ray today, you will receive an invoice from [facility]. Please contact [facility] at [phone] with questions or concerns regarding your invoice.   IF you received labwork today, you will receive an invoice from [facility]. Please contact [facility] at [phone] with questions or concerns regarding your invoice.   Our billing staff will not be able to assist you with questions regarding bills from these companies.  You will be contacted with the lab results as soon as they are available. The fastest way to get your results is to activate your My Chart account. Instructions are located on the last page of this paperwork. If you have not heard from [place] regarding the results in 2 weeks, please contact this office.      I personally performed the services described in this documentation, which was scribed in my presence. The recorded information has been reviewed and considered for accuracy and completeness, addended by me as needed, and agree with information above.  Signed,   Meredith Staggers, MD [facility].  07/19/17 6:12 PM

## 2017-07-19 NOTE — Patient Instructions (Addendum)
Eyelid lesions appear to be from chalazion, which are not typically infected. Warm compresses 3-4 times per day may help at this time, but call me if that is not improving in the next week and I can refer you to ophthalmology. If you see any increased redness of the eyelids or spreading redness around the eye, discharge, or any symptoms inside the eyes, return for recheck sooner.   Chalazion A chalazion is a swelling or lump on the eyelid. It can affect the upper or lower eyelid. What are the causes? This condition may be caused by:  Long-lasting (chronic) inflammation of the eyelid glands.  A blocked oil gland in the eyelid.  What are the signs or symptoms? Symptoms of this condition include:  A swelling on the eyelid. The swelling may spread to areas around the eye.  A hard lump on the eyelid. This lump may make it hard to see out of the eye.  How is this diagnosed? This condition is diagnosed with an examination of the eye. How is this treated? This condition is treated by applying a warm compress to the eyelid. If the condition does not improve after two days, it may be treated with:  Surgery.  Medicine that is injected into the chalazion by a health care provider.  Medicine that is applied to the eye.  Follow these instructions at home:  Do not touch the chalazion.  Do not try to remove the pus, such as by squeezing the chalazion or sticking it with a pin or needle.  Do not rub your eyes.  Wash your hands often. Dry your hands with a clean towel.  Keep your face, scalp, and eyebrows clean.  Avoid wearing eye makeup.  Apply a warm, moist compress to the eyelid 4-6 times a day for 10-15 minutes at a time. This will help to open any blocked glands and help to reduce redness and swelling.  Apply over-the-counter and prescription medicines only as told by your health care provider.  If the chalazion does not break open (rupture) on its own in a month, return to your  health care provider.  Keep all follow-up appointments as told by your health care provider. This is important. Contact a health care provider if:  Your eyelid has not improved in 4 weeks.  Your eyelid is getting worse.  You have a fever.  The chalazion does not rupture on its own with home treatment in a month. Get help right away if:  You have pain in your eye.  Your vision changes.  The chalazion becomes painful or red  The chalazion gets bigger. This information is not intended to replace advice given to you by your health care provider. Make sure you discuss any questions you have with your health care provider. Document Released: 05/08/2000 Document Revised: 10/17/2015 Document Reviewed: 09/03/2014 Elsevier Interactive Patient Education  2018 [employer]      IF you received an x-ray today, you will receive an invoice from [facility]. Please contact [facility] at [phone] with questions or concerns regarding your invoice.   IF you received labwork today, you will receive an invoice from [facility]. Please contact [facility] at [phone] with questions or concerns regarding your invoice.   Our billing staff will not be able to assist you with questions regarding bills from these companies.  You will be contacted with the lab results as soon as they are available. The fastest way to get your results is to activate your My Chart account.  Instructions are located on the last page of this paperwork. If you have not heard from [place] regarding the results in 2 weeks, please contact this office.

## 2017-08-08 DIAGNOSIS — J029 Acute pharyngitis, unspecified: Secondary | ICD-10-CM | POA: Diagnosis not present

## 2017-09-01 ENCOUNTER — Encounter: Payer: Self-pay | Admitting: Physician Assistant

## 2018-04-18 ENCOUNTER — Ambulatory Visit (INDEPENDENT_AMBULATORY_CARE_PROVIDER_SITE_OTHER): Payer: Commercial Managed Care - PPO | Admitting: Family Medicine

## 2018-04-18 ENCOUNTER — Other Ambulatory Visit: Payer: Self-pay

## 2018-04-18 ENCOUNTER — Encounter: Payer: Self-pay | Admitting: Family Medicine

## 2018-04-18 VITALS — BP 146/79 | HR 77 | Temp 99.2°F | Resp 18 | Ht 68.07 in | Wt 164.0 lb

## 2018-04-18 DIAGNOSIS — J302 Other seasonal allergic rhinitis: Secondary | ICD-10-CM | POA: Diagnosis not present

## 2018-04-18 DIAGNOSIS — B349 Viral infection, unspecified: Secondary | ICD-10-CM | POA: Diagnosis not present

## 2018-04-18 MED ORDER — PREDNISONE 20 MG PO TABS: ORAL_TABLET | ORAL | 0 refills | Status: DC

## 2018-04-18 NOTE — Progress Notes (Signed)
Patient ID: Jeremy Hunter, male    DOB: 03/31/1966  Age: 52 y.o. MRN: [id number]  Chief Complaint  Patient presents with  . Allergies    x4weeks     Subjective:   52 year old man who has been struggling with allergies for the past month.  He gets these problems every year about this time but they have been worse this time around.  He has taken some Sudafed.  He has resisted getting anything else until now, and has developed some body aches.  No documented fevers at home generally but today is mildly elevated here in the office.  He does not smoke.  He works at [employer], but it is not an excessively dusty environment.  He does do his yard work and that aggravates things.  Current allergies, medications, problem list, past/family and social histories reviewed.  Objective:  BP (!) 146/79   Pulse 77   Temp 99.2 F (37.3 C) (Oral)   Resp 18   Ht 5' 8.07" (1.729 m)   Wt 164 lb (74.4 kg)   SpO2 98%   BMI 24.88 kg/m   No major acute distress.  Blood pressure is mildly elevated as noted.  His TMs are normal.  Throat clear.  Sinuses nontender.  Neck supple without significant nodes.  Chest is clear to auscultation.  Heart regular without murmurs, gallops, or arrhythmias.  Assessment & Plan:   Assessment: 1. Viral illness   2. Seasonal allergies       Plan: Symptoms are not consistent with just the allergies, which should not cause the aching.  I think he probably has a little viral illness on top of things.  Will try and let that right its course and see if he does not feel better over the next few days with just antihistamine therapy, but if he is not doing better he is to go ahead and take that tapered dose course of prednisone which I have ordered for him.  No orders of the defined types were placed in this encounter.   Meds ordered this encounter  Medications  . predniSONE (DELTASONE) 20 MG tablet    Sig: Take 3 orally daily for 2 days, then 2 daily for 2 days, then 1 daily  for 2 days.  Best taken after breakfast.    Dispense:  12 tablet    Refill:  0    Do not fill after 05/01/18         Patient Instructions    For the immediate future try to avoid a lot of of environmental dust such as blowing leaves and things like that.  Take a good over the counter antihistamine such as Allegra or Claritin or Zyrtec (fexofenadine, loratadine, or cetirizine)  Take Tylenol 500 mg 2 pills 3 times daily (acetaminophen) or ibuprofen 200 mg 3 to 4 pills 3 times daily if needed for aching and/or fever.  Use the pseudoephedrine (Sudafed) only if needed for a lot of head congestion  If you continue to feel bad after a few days, which gives enough time for a viral illness to run its course, then go ahead and try taking the prescription for prednisone.  Prednisone 20 mg 3 pills daily for 2 days, then 2 daily for 2 days, then 1 daily for 2 days best taken in the morning after breakfast.  Return if worse   If you have lab work done today you will be contacted with your lab results within the next 2 weeks.  If  you have not heard from [place] then please contact us. The fastest way to get your results is to register for My Chart.   IF you received an x-ray today, you will receive an invoice from [facility]. Please contact [facility] at [phone] with questions or concerns regarding your invoice.   IF you received labwork today, you will receive an invoice from [place]. Please contact [facility] at [phone] with questions or concerns regarding your invoice.   Our billing staff will not be able to assist you with questions regarding bills from these companies.  You will be contacted with the lab results as soon as they are available. The fastest way to get your results is to activate your My Chart account. Instructions are located on the last page of this paperwork. If you have not heard from [place] regarding the results in 2 weeks, please contact this  office.        No follow-ups on file.   Janace Hoard, MD 04/18/2018

## 2018-04-18 NOTE — Patient Instructions (Addendum)
  For the immediate future try to avoid a lot of of environmental dust such as blowing leaves and things like that.  Take a good over the counter antihistamine such as Allegra or Claritin or Zyrtec (fexofenadine, loratadine, or cetirizine)  Take Tylenol 500 mg 2 pills 3 times daily (acetaminophen) or ibuprofen 200 mg 3 to 4 pills 3 times daily if needed for aching and/or fever.  Use the pseudoephedrine (Sudafed) only if needed for a lot of head congestion  If you continue to feel bad after a few days, which gives enough time for a viral illness to run its course, then go ahead and try taking the prescription for prednisone.  Prednisone 20 mg 3 pills daily for 2 days, then 2 daily for 2 days, then 1 daily for 2 days best taken in the morning after breakfast.  Return if worse   If you have lab work done today you will be contacted with your lab results within the next 2 weeks.  If you have not heard from us then please contact us. The fastest way to get your results is to register for My Chart.   IF you received an x-ray today, you will receive an invoice from [facility]. Please contact [facility] at [phone] with questions or concerns regarding your invoice.   IF you received labwork today, you will receive an invoice from [facility]. Please contact [facility] at [phone] with questions or concerns regarding your invoice.   Our billing staff will not be able to assist you with questions regarding bills from these companies.  You will be contacted with the lab results as soon as they are available. The fastest way to get your results is to activate your My Chart account. Instructions are located on the last page of this paperwork. If you have not heard from us regarding the results in 2 weeks, please contact this office.

## 2019-05-03 ENCOUNTER — Encounter: Payer: Self-pay | Admitting: Registered Nurse

## 2019-05-03 ENCOUNTER — Other Ambulatory Visit: Payer: Self-pay

## 2019-05-03 ENCOUNTER — Ambulatory Visit (INDEPENDENT_AMBULATORY_CARE_PROVIDER_SITE_OTHER): Payer: Commercial Managed Care - PPO | Admitting: Registered Nurse

## 2019-05-03 VITALS — BP 140/88 | HR 60 | Temp 98.6°F | Resp 12 | Ht 67.0 in | Wt 160.0 lb

## 2019-05-03 DIAGNOSIS — Z13228 Encounter for screening for other metabolic disorders: Secondary | ICD-10-CM

## 2019-05-03 DIAGNOSIS — Z0000 Encounter for general adult medical examination without abnormal findings: Secondary | ICD-10-CM | POA: Diagnosis not present

## 2019-05-03 DIAGNOSIS — Z1329 Encounter for screening for other suspected endocrine disorder: Secondary | ICD-10-CM

## 2019-05-03 DIAGNOSIS — Z130 Encounter for screening for diseases of the blood and blood-forming organs and certain disorders involving the immune mechanism: Secondary | ICD-10-CM

## 2019-05-03 DIAGNOSIS — Z13220 Encounter for screening for lipoid disorders: Secondary | ICD-10-CM

## 2019-05-03 NOTE — Patient Instructions (Addendum)
Mr Pulsifer -  Your exam showed no abnormalities today.  We have drawn the following labs: CBC, CMP, TSH, A1c, and Lipid Panel. These results should be back within 1-2 weeks at most. I will call you with any concerning results.  Your blood pressure was within normal limits today, but was on the high side of normal. Please keep an eye on this and return to our clinic with any concerns.  Overall, you seem to be in great health and I have no concerns for you at this time.  Jari Sportsman, NP  If you have lab work done today you will be contacted with your lab results within the next 2 weeks.  If you have not heard from [place] then please contact us. The fastest way to get your results is to register for My Chart.   IF you received an x-ray today, you will receive an invoice from [facility]. Please contact [facility] at [phone] with questions or concerns regarding your invoice.   IF you received labwork today, you will receive an invoice from [facility]. Please contact [facility] at [phone] with questions or concerns regarding your invoice.   Our billing staff will not be able to assist you with questions regarding bills from these companies.  You will be contacted with the lab results as soon as they are available. The fastest way to get your results is to activate your My Chart account. Instructions are located on the last page of this paperwork. If you have not heard from [place] regarding the results in 2 weeks, please contact this office.       Health Maintenance, Male Adopting a healthy lifestyle and getting preventive care are important in promoting health and wellness. Ask your health care provider about:  The right schedule for you to have regular tests and exams.  Things you can do on your own to prevent diseases and keep yourself healthy. What should I know about diet, weight, and exercise? Eat a healthy diet   Eat a diet that includes plenty of vegetables,  fruits, low-fat dairy products, and lean protein.  Do not eat a lot of foods that are high in solid fats, added sugars, or sodium. Maintain a healthy weight Body mass index (BMI) is a measurement that can be used to identify possible weight problems. It estimates body fat based on height and weight. Your health care provider can help determine your BMI and help you achieve or maintain a healthy weight. Get regular exercise Get regular exercise. This is one of the most important things you can do for your health. Most adults should:  Exercise for at least 150 minutes each week. The exercise should increase your heart rate and make you sweat (moderate-intensity exercise).  Do strengthening exercises at least twice a week. This is in addition to the moderate-intensity exercise.  Spend less time sitting. Even light physical activity can be beneficial. Watch cholesterol and blood lipids Have your blood tested for lipids and cholesterol at 55 years of age, then have this test every 5 years. You may need to have your cholesterol levels checked more often if:  Your lipid or cholesterol levels are high.  You are older than 55 years of age.  You are at high risk for heart disease. What should I know about cancer screening? Many types of cancers can be detected early and may often be prevented. Depending on your health history and family history, you may need to have cancer screening at various  ages. This may include screening for:  Colorectal cancer.  Prostate cancer.  Skin cancer.  Lung cancer. What should I know about heart disease, diabetes, and high blood pressure? Blood pressure and heart disease  High blood pressure causes heart disease and increases the risk of stroke. This is more likely to develop in people who have high blood pressure readings, are of African descent, or are overweight.  Talk with your health care provider about your target blood pressure readings.  Have your  blood pressure checked: ? Every 3-5 years if you are 6-68 years of age. ? Every year if you are 34 years old or older.  If you are between the ages of 54 and 3 and are a current or former smoker, ask your health care provider if you should have a one-time screening for abdominal aortic aneurysm (AAA). Diabetes Have regular diabetes screenings. This checks your fasting blood sugar level. Have the screening done:  Once every three years after age 70 if you are at a normal weight and have a low risk for diabetes.  More often and at a younger age if you are overweight or have a high risk for diabetes. What should I know about preventing infection? Hepatitis B If you have a higher risk for hepatitis B, you should be screened for this virus. Talk with your health care provider to find out if you are at risk for hepatitis B infection. Hepatitis C Blood testing is recommended for:  Everyone born from [birth year] through 1967.  Anyone with known risk factors for hepatitis C. Sexually transmitted infections (STIs)  You should be screened each year for STIs, including gonorrhea and chlamydia, if: ? You are sexually active and are younger than 55 years of age. ? You are older than 54 years of age and your health care provider tells you that you are at risk for this type of infection. ? Your sexual activity has changed since you were last screened, and you are at increased risk for chlamydia or gonorrhea. Ask your health care provider if you are at risk.  Ask your health care provider about whether you are at high risk for HIV. Your health care provider may recommend a prescription medicine to help prevent HIV infection. If you choose to take medicine to prevent HIV, you should first get tested for HIV. You should then be tested every 3 months for as long as you are taking the medicine. Follow these instructions at home: Lifestyle  Do not use any products that contain nicotine or tobacco, such as  cigarettes, e-cigarettes, and chewing tobacco. If you need help quitting, ask your health care provider.  Do not use street drugs.  Do not share needles.  Ask your health care provider for help if you need support or information about quitting drugs. Alcohol use  Do not drink alcohol if your health care provider tells you not to drink.  If you drink alcohol: ? Limit how much you have to 0-2 drinks a day. ? Be aware of how much alcohol is in your drink. In the [place] one drink equals one 12 oz bottle of beer (355 mL), one 5 oz glass of wine (148 mL), or one 1 oz glass of hard liquor (44 mL). General instructions  Schedule regular health, dental, and eye exams.  Stay current with your vaccines.  Tell your health care provider if: ? You often feel depressed. ? You have ever been abused or do not feel safe at home. Summary  Adopting a healthy lifestyle and getting preventive care are important in promoting health and wellness.  Follow your health care provider's instructions about healthy diet, exercising, and getting tested or screened for diseases.  Follow your health care provider's instructions on monitoring your cholesterol and blood pressure. This information is not intended to replace advice given to you by your health care provider. Make sure you discuss any questions you have with your health care provider. Document Released: 11/07/2007 Document Revised: 05/04/2018 Document Reviewed: 05/04/2018 Elsevier Patient Education  2020 [employer].     Why follow it? Research shows. . Those who follow the Mediterranean diet have a reduced risk of heart disease  . The diet is associated with a reduced incidence of Parkinson's and Alzheimer's diseases . People following the diet may have longer life expectancies and lower rates of chronic diseases  . The Dietary Guidelines for Americans recommends the Mediterranean diet as an eating plan to promote health and prevent disease  What  Is the Mediterranean Diet?  . Healthy eating plan based on typical foods and recipes of Mediterranean-style cooking . The diet is primarily a plant based diet; these foods should make up a majority of meals   Starches - Plant based foods should make up a majority of meals - They are an important sources of vitamins, minerals, energy, antioxidants, and fiber - Choose whole grains, foods high in fiber and minimally processed items  - Typical grain sources include wheat, oats, barley, corn, brown rice, bulgar, farro, millet, polenta, couscous  - Various types of beans include chickpeas, lentils, fava beans, black beans, white beans   Fruits  Veggies - Large quantities of antioxidant rich fruits & veggies; 6 or more servings  - Vegetables can be eaten raw or lightly drizzled with oil and cooked  - Vegetables common to the traditional Mediterranean Diet include: artichokes, arugula, beets, broccoli, brussel sprouts, cabbage, carrots, celery, collard greens, cucumbers, eggplant, kale, leeks, lemons, lettuce, mushrooms, okra, onions, peas, peppers, potatoes, pumpkin, radishes, rutabaga, shallots, spinach, sweet potatoes, turnips, zucchini - Fruits common to the Mediterranean Diet include: apples, apricots, avocados, cherries, clementines, dates, figs, grapefruits, grapes, melons, nectarines, oranges, peaches, pears, pomegranates, strawberries, tangerines  Fats - Replace butter and margarine with healthy oils, such as olive oil, canola oil, and tahini  - Limit nuts to no more than a handful a day  - Nuts include walnuts, almonds, pecans, pistachios, pine nuts  - Limit or avoid candied, honey roasted or heavily salted nuts - Olives are central to the [employer] - can be eaten whole or used in a variety of dishes   Meats Protein - Limiting red meat: no more than a few times a month - When eating red meat: choose lean cuts and keep the portion to the size of deck of cards - Eggs: approx. 0 to 4  times a week  - Fish and lean poultry: at least 2 a week  - Healthy protein sources include, chicken, [place], lean beef, lamb - Increase intake of seafood such as tuna, salmon, trout, mackerel, shrimp, scallops - Avoid or limit high fat processed meats such as sausage and bacon  Dairy - Include moderate amounts of low fat dairy products  - Focus on healthy dairy such as fat free yogurt, skim milk, low or reduced fat cheese - Limit dairy products higher in fat such as whole or 2% milk, cheese, ice cream  Alcohol - Moderate amounts of red wine is ok  - No more  than 5 oz daily for women (all ages) and men older than age 77  - No more than 10 oz of wine daily for men younger than 62  Other - Limit sweets and other desserts  - Use herbs and spices instead of salt to flavor foods  - Herbs and spices common to the traditional Mediterranean Diet include: basil, bay leaves, chives, cloves, cumin, fennel, garlic, lavender, marjoram, mint, oregano, parsley, pepper, rosemary, sage, savory, sumac, tarragon, thyme   It's not just a diet, it's a lifestyle:  . The Mediterranean diet includes lifestyle factors typical of those in the region  . Foods, drinks and meals are best eaten with others and savored . Daily physical activity is important for overall good health . This could be strenuous exercise like running and aerobics . This could also be more leisurely activities such as walking, housework, yard-work, or taking the stairs . Moderation is the key; a balanced and healthy diet accommodates most foods and drinks . Consider portion sizes and frequency of consumption of certain foods   Meal Ideas & Options:  . Breakfast:  o Whole wheat toast or whole wheat English muffins with peanut butter & hard boiled egg o Steel cut oats topped with apples & cinnamon and skim milk  o Fresh fruit: banana, strawberries, melon, berries, peaches  o Smoothies: strawberries, bananas, greek yogurt, peanut butter o Low  fat greek yogurt with blueberries and granola  o Egg white omelet with spinach and mushrooms o Breakfast couscous: whole wheat couscous, apricots, skim milk, cranberries  . Sandwiches:  o Hummus and grilled vegetables (peppers, zucchini, squash) on whole wheat bread   o Grilled chicken on whole wheat pita with lettuce, tomatoes, cucumbers or tzatziki  o Tuna salad on whole wheat bread: tuna salad made with greek yogurt, olives, red peppers, capers, green onions o Garlic rosemary lamb pita: lamb sauted with garlic, rosemary, salt & pepper; add lettuce, cucumber, greek yogurt to pita - flavor with lemon juice and black pepper  . Seafood:  o Mediterranean grilled salmon, seasoned with garlic, basil, parsley, lemon juice and black pepper o Shrimp, lemon, and spinach whole-grain pasta salad made with low fat greek yogurt  o Seared scallops with lemon orzo  o Seared tuna steaks seasoned salt, pepper, coriander topped with tomato mixture of olives, tomatoes, olive oil, minced garlic, parsley, green onions and cappers  . Meats:  o Herbed greek chicken salad with kalamata olives, cucumber, feta  o Red bell peppers stuffed with spinach, bulgur, lean ground beef (or lentils) & topped with feta   o Kebabs: skewers of chicken, tomatoes, onions, zucchini, squash  o [place] burgers: made with red onions, mint, dill, lemon juice, feta cheese topped with roasted red peppers . Vegetarian o Cucumber salad: cucumbers, artichoke hearts, celery, red onion, feta cheese, tossed in olive oil & lemon juice  o Hummus and whole grain pita points with a greek salad (lettuce, tomato, feta, olives, cucumbers, red onion) o Lentil soup with celery, carrots made with vegetable broth, garlic, salt and pepper  o Tabouli salad: parsley, bulgur, mint, scallions, cucumbers, tomato, radishes, lemon juice, olive oil, salt and pepper.       Fat and Cholesterol Restricted Eating Plan Eating a diet that limits fat and cholesterol may  help lower your risk for heart disease and other conditions. Your body needs fat and cholesterol for basic functions, but eating too much of these things can be harmful to your health. Your health care provider  may order lab tests to check your blood fat (lipid) and cholesterol levels. This helps your health care provider understand your risk for certain conditions and whether you need to make diet changes. Work with your health care provider or dietitian to make an eating plan that is right for you. Your plan includes:  Limit your fat intake to ______% or less of your total calories a day.  Limit your saturated fat intake to ______% or less of your total calories a day.  Limit the amount of cholesterol in your diet to less than _________mg a day.  Eat ___________ g of fiber a day. What are tips for following this plan? General guidelines   If you are overweight, work with your health care provider to lose weight safely. Losing just 5-10% of your body weight can improve your overall health and help prevent diseases such as diabetes and heart disease.  Avoid: ? Foods with added sugar. ? Fried foods. ? Foods that contain partially hydrogenated oils, including stick margarine, some tub margarines, cookies, crackers, and other baked goods.  Limit alcohol intake to no more than 1 drink a day for nonpregnant women and 2 drinks a day for men. One drink equals 12 oz of beer, 5 oz of wine, or 1 oz of hard liquor. Reading food labels  Check food labels for: ? Trans fats, partially hydrogenated oils, or high amounts of saturated fat. Avoid foods that contain saturated fat and trans fat. ? The amount of cholesterol in each serving. Try to eat no more than 200 mg of cholesterol each day. ? The amount of fiber in each serving. Try to eat at least 20-30 g of fiber each day.  Choose foods with healthy fats, such as: ? Monounsaturated and polyunsaturated fats. These include olive and canola oil,  flaxseeds, walnuts, almonds, and seeds. ? Omega-3 fats. These are found in foods such as salmon, mackerel, sardines, tuna, flaxseed oil, and ground flaxseeds.  Choose grain products that have whole grains. Look for the word "whole" as the first word in the ingredient list. Cooking  Cook foods using methods other than frying. Baking, boiling, grilling, and broiling are some healthy options.  Eat more home-cooked food and less restaurant, buffet, and fast food.  Avoid cooking using saturated fats. ? Animal sources of saturated fats include meats, butter, and cream. ? Plant sources of saturated fats include palm oil, palm kernel oil, and coconut oil. Meal planning   At meals, imagine dividing your plate into fourths: ? Fill one-half of your plate with vegetables and green salads. ? Fill one-fourth of your plate with whole grains. ? Fill one-fourth of your plate with lean protein foods.  Eat fish that is high in omega-3 fats at least two times a week.  Eat more foods that contain fiber, such as whole grains, beans, apples, broccoli, carrots, peas, and barley. These foods help promote healthy cholesterol levels in the blood. Recommended foods Grains  Whole grains, such as whole wheat or whole grain breads, crackers, cereals, and pasta. Unsweetened oatmeal, bulgur, barley, quinoa, or brown rice. Corn or whole wheat flour tortillas. Vegetables  Fresh or frozen vegetables (raw, steamed, roasted, or grilled). Green salads. Fruits  All fresh, canned (in natural juice), or frozen fruits. Meats and other protein foods  Ground beef (85% or leaner), grass-fed beef, or beef trimmed of fat. Skinless chicken or [place]. Ground chicken or [place]. Pork trimmed of fat. All fish and seafood. Egg whites. Dried beans, peas, or lentils.  Unsalted nuts or seeds. Unsalted canned beans. Natural nut butters without added sugar and oil. Dairy  Low-fat or nonfat dairy products, such as skim or 1% milk, 2% or  reduced-fat cheeses, low-fat and fat-free ricotta or cottage cheese, or plain low-fat and nonfat yogurt. Fats and oils  Tub margarine without trans fats. Light or reduced-fat mayonnaise and salad dressings. Avocado. Olive, canola, sesame, or safflower oils. The items listed above may not be a complete list of recommended foods or beverages. Contact your dietitian for more options. Foods to avoid Grains  White bread. White pasta. White rice. Cornbread. Bagels, pastries, and croissants. Crackers and snack foods that contain trans fat and hydrogenated oils. Vegetables  Vegetables cooked in cheese, cream, or butter sauce. Fried vegetables. Fruits  Canned fruit in heavy syrup. Fruit in cream or butter sauce. Fried fruit. Meats and other protein foods  Fatty cuts of meat. Ribs, chicken wings, bacon, sausage, bologna, salami, chitterlings, fatback, hot dogs, bratwurst, and packaged lunch meats. Liver and organ meats. Whole eggs and egg yolks. Chicken and [place] with skin. Fried meat. Dairy  Whole or 2% milk, cream, half-and-half, and cream cheese. Whole milk cheeses. Whole-fat or sweetened yogurt. Full-fat cheeses. Nondairy creamers and whipped toppings. Processed cheese, cheese spreads, and cheese curds. Beverages  Alcohol. Sugar-sweetened drinks such as sodas, lemonade, and fruit drinks. Fats and oils  Butter, stick margarine, lard, shortening, ghee, or bacon fat. Coconut, palm kernel, and palm oils. Sweets and desserts  Corn syrup, sugars, honey, and molasses. Candy. Jam and jelly. Syrup. Sweetened cereals. Cookies, pies, cakes, donuts, muffins, and ice cream. The items listed above may not be a complete list of foods and beverages to avoid. Contact your dietitian for more information. Summary  Your body needs fat and cholesterol for basic functions. However, eating too much of these things can be harmful to your health.  Work with your health care provider and dietitian to follow a  diet low in fat and cholesterol. Doing this may help lower your risk for heart disease and other conditions.  Choose healthy fats, such as monounsaturated and polyunsaturated fats, and foods high in omega-3 fatty acids.  Eat fiber-rich foods, such as whole grains, beans, peas, fruits, and vegetables.  Limit or avoid alcohol, fried foods, and foods high in saturated fats, partially hydrogenated oils, and sugar. This information is not intended to replace advice given to you by your health care provider. Make sure you discuss any questions you have with your health care provider. Document Released: 05/11/2005 Document Revised: 04/23/2017 Document Reviewed: 01/26/2017 Elsevier Patient Education  2020 [employer].  American Heart Association (AHA) Exercise Recommendation  Being physically active is important to prevent heart disease and stroke, the nation's No. 1and No. 5killers. To improve overall cardiovascular health, we suggest at least 150 minutes per week of moderate exercise or 75 minutes per week of vigorous exercise (or a combination of moderate and vigorous activity). Thirty minutes a day, five times a week is an easy goal to remember. You will also experience benefits even if you divide your time into two or three segments of 10 to 15 minutes per day.  For people who would benefit from lowering their blood pressure or cholesterol, we recommend 40 minutes of aerobic exercise of moderate to vigorous intensity three to four times a week to lower the risk for heart attack and stroke.  Physical activity is anything that makes you move your body and burn calories.  This includes things like climbing stairs  or playing sports. Aerobic exercises benefit your heart, and include walking, jogging, swimming or biking. Strength and stretching exercises are best for overall stamina and flexibility.  The simplest, positive change you can make to effectively improve your heart health is to start  walking. It's enjoyable, free, easy, social and great exercise. A walking program is flexible and boasts high success rates because people can stick with it. It's easy for walking to become a regular and satisfying part of life.   For Overall Cardiovascular Health:  At least 30 minutes of moderate-intensity aerobic activity at least 5 days per week for a total of 150  OR   At least 25 minutes of vigorous aerobic activity at least 3 days per week for a total of 75 minutes; or a combination of moderate- and vigorous-intensity aerobic activity  AND   Moderate- to high-intensity muscle-strengthening activity at least 2 days per week for additional health benefits.  For Lowering Blood Pressure and Cholesterol  An average 40 minutes of moderate- to vigorous-intensity aerobic activity 3 or 4 times per week  What if I can't make it to the time goal? Something is always better than nothing! And everyone has to start somewhere. Even if you've been sedentary for years, today is the day you can begin to make healthy changes in your life. If you don't think you'll make it for 30 or 40 minutes, set a reachable goal for today. You can work up toward your overall goal by increasing your time as you get stronger. Don't let all-or-nothing thinking rob you of doing what you can every day.  Source:http://www.heart.org

## 2019-05-03 NOTE — Progress Notes (Signed)
 Established Patient Office Visit  Subjective:  Patient ID: Jeremy Hunter, male    DOB: 03/04/1966  Age: 53 y.o. MRN: [id number]  CC:  Chief Complaint  Patient presents with  . Annual Exam    HPI [name] presents for CPE  No complaints, feels well overall.  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 Definitely contributing to his stress and limiting his physical activity. States his wife is concerned that his BP is high - wnl at visit today.  Otherwise, only medical history is for nephrolithiasis and vasectomy.  Past Medical History:  Diagnosis Date  . Nephrolithiasis    right ureteral stone    Past Surgical History:  Procedure Laterality Date  . VASECTOMY      Family History  Problem Relation Age of Onset  . Heart disease Mother   . Heart disease Father     Social History   Socioeconomic History  . Marital status: Married    Spouse name: Not on file  . Number of children: Not on file  . Years of education: Not on file  . Highest education level: Not on file  Occupational History  . Not on file  Social Needs  . Financial resource strain: Not hard at all  . Food insecurity    Worry: Never true    Inability: Never true  . Transportation needs    Medical: No    Non-medical: No  Tobacco Use  . Smoking status: Never Smoker  . Smokeless tobacco: Never Used  Substance and Sexual Activity  . Alcohol use: No    Alcohol/week: 0.0 standard drinks  . Drug use: No  . Sexual activity: Yes  Lifestyle  . Physical activity    Days per week: 3 days    Minutes per session: 30 min  . Stress: Only a little  Relationships  . Social connections    Talks on phone: Three times a week    Gets together: Twice a week    Attends religious service: Patient refused    Active member of club or organization: Patient refused    Attends meetings of clubs or organizations: Patient refused   Relationship status: Patient refused  . Intimate partner violence    Fear of current or ex partner: No    Emotionally abused: No    Physically abused: No    Forced sexual activity: No  Other Topics Concern  . Not on file  Social History Narrative  . Not on file    Outpatient Medications Prior to Visit  Medication Sig Dispense Refill  . ketoconazole (NIZORAL) 2 % cream Apply 1 application topically daily. 60 g 1  . lidocaine (XYLOCAINE) 2 % solution Use as directed 5-10 mLs in the mouth or throat as needed for mouth pain. 100 mL 0  . oxyCODONE-acetaminophen (ROXICET) 5-325 MG per tablet Take 1 tablet by mouth every 6 (six) hours as needed for severe pain. 30 tablet 0  . predniSONE (DELTASONE) 20 MG tablet Take 3 orally daily for 2 days, then 2 daily for 2 days, then 1 daily for 2 days.  Best taken after breakfast. 12 tablet 0  . triamcinolone cream (KENALOG) 0.1 % Apply 1 application topically 2 (two) times daily. 30 g 0  . triamcinolone cream (KENALOG) 0.1 % Apply 1 application topically 2 (two) times daily. 30 g 0   No facility-administered medications prior to visit.     No   Known Allergies  ROS Review of Systems  Constitutional: Negative.   HENT: Negative.   Eyes: Negative.   Respiratory: Negative.   Cardiovascular: Negative.   Gastrointestinal: Negative.   Endocrine: Negative.   Genitourinary: Negative.   Musculoskeletal: Negative.   Skin: Negative.   Allergic/Immunologic: Negative.   Neurological: Negative.   Hematological: Negative.   Psychiatric/Behavioral: Negative.   All other systems reviewed and are negative.     Objective:    Physical Exam  Constitutional: He is oriented to person, place, and time. He appears well-developed and well-nourished. No distress.  HENT:  Head: Normocephalic and atraumatic.  Right Ear: External ear normal.  Left Ear: External ear normal.  Nose: Nose normal.  Mouth/Throat: Oropharynx is clear and moist. No oropharyngeal  exudate.  Eyes: Pupils are equal, round, and reactive to light. Conjunctivae and EOM are normal. Right eye exhibits no discharge. Left eye exhibits no discharge. No scleral icterus.  Neck: Normal range of motion. Neck supple. No tracheal deviation present. No thyromegaly present.  Cardiovascular: Normal rate, regular rhythm, normal heart sounds and intact distal pulses. Exam reveals no gallop and no friction rub.  No murmur heard. Pulmonary/Chest: Effort normal and breath sounds normal. No respiratory distress. He has no wheezes. He has no rales. He exhibits no tenderness.  Abdominal: Soft. Bowel sounds are normal. He exhibits no distension and no mass. There is no abdominal tenderness. There is no rebound and no guarding.  Musculoskeletal: Normal range of motion.        General: No tenderness, deformity or edema.  Lymphadenopathy:    He has no cervical adenopathy.  Neurological: He is alert and oriented to person, place, and time. No cranial nerve deficit. He exhibits normal muscle tone. Coordination normal.  Skin: Skin is warm and dry. No rash noted. He is not diaphoretic. No erythema. No pallor.  Psychiatric: He has a normal mood and affect. His behavior is normal. Judgment and thought content normal.  Nursing note and vitals reviewed.   BP 140/88 (BP Location: Right Arm, Patient Position: Sitting, Cuff Size: Normal)   Pulse 60   Temp 98.6 F (37 C)   Resp 12   Ht 5' 7" (1.702 m)   Wt 160 lb (72.6 kg)   SpO2 99%   BMI 25.06 kg/m  Wt Readings from Last 3 Encounters:  05/03/19 160 lb (72.6 kg)  04/18/18 164 lb (74.4 kg)  07/19/17 160 lb (72.6 kg)     Health Maintenance Due  Topic Date Due  . Hepatitis C Screening  12/15/1963  . HIV Screening  08/31/1978  . COLONOSCOPY  08/30/2013    There are no preventive care reminders to display for this patient.  Lab Results  Component Value Date   TSH 3.727 05/02/2015   Lab Results  Component Value Date   WBC 7.6 05/02/2015    HGB 16.2 05/02/2015   HCT 46.8 05/02/2015   MCV 84.8 05/02/2015   PLT 287 05/02/2015   Lab Results  Component Value Date   NA 139 05/02/2015   K 4.0 05/02/2015   CO2 25 05/02/2015   GLUCOSE 80 05/02/2015   BUN 13 05/02/2015   CREATININE 0.93 05/02/2015   BILITOT 0.9 05/02/2015   ALKPHOS 48 05/02/2015   AST 21 05/02/2015   ALT 16 05/02/2015   PROT 6.6 05/02/2015   ALBUMIN 4.4 05/02/2015   CALCIUM 9.4 05/02/2015   Lab Results  Component Value Date   CHOL 137 05/02/2015   Lab Results  Component   Value Date   HDL 43 05/02/2015   Lab Results  Component Value Date   LDLCALC 79 05/02/2015   Lab Results  Component Value Date   TRIG 73 05/02/2015   Lab Results  Component Value Date   CHOLHDL 3.2 05/02/2015   No results found for: HGBA1C    Assessment & Plan:   Problem List Items Addressed This Visit    None    Visit Diagnoses    Screening for endocrine, metabolic and immunity disorder    -  Primary   Relevant Orders   TSH   Hemoglobin A1c   CBC   CMP14+EGFR   Lipid screening       Relevant Orders   Lipid panel   Routine general medical examination at a health care facility          No orders of the defined types were placed in this encounter.   Follow-up: Return in 1 year (on 05/02/2020) for CPE.   PLAN  No abnormal findings on exam  Labs drawn, will follow up as warranted  Paperwork filled out and signed  Pt to return in 1 year for CPE  Patient encouraged to call clinic with any questions, comments, or concerns.  Richard Morrow, NP 

## 2019-05-04 LAB — CMP14+EGFR
ALT: 17 IU/L (ref 0–44)
AST: 13 IU/L (ref 0–40)
Albumin/Globulin Ratio: 2.2 (ref 1.2–2.2)
Albumin: 4.6 g/dL (ref 3.8–4.9)
Alkaline Phosphatase: 60 IU/L (ref 39–117)
BUN/Creatinine Ratio: 14 (ref 9–20)
BUN: 13 mg/dL (ref 6–24)
Bilirubin Total: 0.9 mg/dL (ref 0.0–1.2)
CO2: 21 mmol/L (ref 20–29)
Calcium: 9.2 mg/dL (ref 8.7–10.2)
Chloride: 103 mmol/L (ref 96–106)
Creatinine, Ser: 0.96 mg/dL (ref 0.76–1.27)
GFR calc Af Amer: 102 mL/min/{1.73_m2} (ref >59)
GFR calc non Af Amer: 89 mL/min/{1.73_m2} (ref >59)
Globulin, Total: 2.1 g/dL (ref 1.5–4.5)
Glucose: 83 mg/dL (ref 65–99)
Potassium: 4 mmol/L (ref 3.5–5.2)
Sodium: 140 mmol/L (ref 134–144)
Total Protein: 6.7 g/dL (ref 6.0–8.5)

## 2019-05-04 LAB — LIPID PANEL
Chol/HDL Ratio: 3.5 ratio (ref 0.0–5.0)
Cholesterol, Total: 177 mg/dL (ref 100–199)
HDL: 51 mg/dL (ref >39)
LDL Chol Calc (NIH): 112 mg/dL — ABNORMAL HIGH (ref 0–99)
Triglycerides: 77 mg/dL (ref 0–149)
VLDL Cholesterol Cal: 14 mg/dL (ref 5–40)

## 2019-05-04 LAB — CBC
Hematocrit: 48.1 % (ref 37.5–51.0)
Hemoglobin: 16.4 g/dL (ref 13.0–17.7)
MCH: 29.1 pg (ref 26.6–33.0)
MCHC: 34.1 g/dL (ref 31.5–35.7)
MCV: 85 fL (ref 79–97)
Platelets: 260 10*3/uL (ref 150–450)
RBC: 5.64 x10E6/uL (ref 4.14–5.80)
RDW: 12.7 % (ref 11.6–15.4)
WBC: 6.6 10*3/uL (ref 3.4–10.8)

## 2019-05-04 LAB — TSH: TSH: 5.73 u[IU]/mL — ABNORMAL HIGH (ref 0.450–4.500)

## 2019-05-04 LAB — HEMOGLOBIN A1C
Est. average glucose Bld gHb Est-mCnc: 100 mg/dL
Hgb A1c MFr Bld: 5.1 % (ref 4.8–5.6)

## 2019-05-08 ENCOUNTER — Encounter: Payer: Self-pay | Admitting: Registered Nurse

## 2019-05-08 NOTE — Progress Notes (Signed)
Labs mostly unconcerning - elevated TSH.  Sent letter to pt via [place], NP

## 2019-09-25 ENCOUNTER — Other Ambulatory Visit: Payer: Self-pay | Admitting: Registered Nurse

## 2019-09-25 DIAGNOSIS — L219 Seborrheic dermatitis, unspecified: Secondary | ICD-10-CM

## 2019-09-25 NOTE — Telephone Encounter (Signed)
Requested medication (s) are due for refill today: Yes  Requested medication (s) are on the active medication list: Yes  Last refill:  03/05/17  Future visit scheduled: No  Notes to clinic:  Prescription has expired.    Requested Prescriptions  Pending Prescriptions Disp Refills   ketoconazole (NIZORAL) 2 % cream 60 g 1    Sig: Apply 1 application topically daily.      Over the Counter:  OTC Passed - 09/25/2019 11:12 AM      Passed - Valid encounter within last 12 months    Recent Outpatient Visits           4 months ago Screening for endocrine, metabolic and immunity disorder   [facility], Gerlene Burdock, NP   1 year ago Viral illness   [facility] at [facility], Sandria Bales, MD   2 years ago Chalazion, unspecified laterality   [facility] at [facility], Asencion Partridge, MD   2 years ago Seborrhea   [facility], Marolyn Hammock, PA-C   2 years ago Sore throat   Primary Care at [facility], Marolyn Hammock, PA-C

## 2019-09-25 NOTE — Telephone Encounter (Signed)
Medication: ketoconazole (NIZORAL) 2 % cream [[id number]]   Has the patient contacted their pharmacy? Yes  (Agent: If no, request that the patient contact the pharmacy for the refill.) (Agent: If yes, when and what did the pharmacy advise?)  Preferred Pharmacy (with phone number or street name): [id number] - [place], [place] - [address]  Phone:  [phone] Fax:  [phone]     Agent: Please be advised that RX refills may take up to 3 business days. We ask that you follow-up with your pharmacy.

## 2021-08-04 DIAGNOSIS — H10023 Other mucopurulent conjunctivitis, bilateral: Secondary | ICD-10-CM | POA: Diagnosis not present

## 2022-07-11 ENCOUNTER — Other Ambulatory Visit (HOSPITAL_BASED_OUTPATIENT_CLINIC_OR_DEPARTMENT_OTHER): Payer: Self-pay

## 2022-07-11 ENCOUNTER — Emergency Department (HOSPITAL_BASED_OUTPATIENT_CLINIC_OR_DEPARTMENT_OTHER): Payer: 59

## 2022-07-11 ENCOUNTER — Encounter (HOSPITAL_BASED_OUTPATIENT_CLINIC_OR_DEPARTMENT_OTHER): Payer: Self-pay

## 2022-07-11 ENCOUNTER — Emergency Department (HOSPITAL_BASED_OUTPATIENT_CLINIC_OR_DEPARTMENT_OTHER)
Admission: EM | Admit: 2022-07-11 | Discharge: 2022-07-11 | Disposition: A | Discharge status: Home / Self Care | Payer: 59 | Attending: Emergency Medicine | Admitting: Emergency Medicine

## 2022-07-11 ENCOUNTER — Other Ambulatory Visit: Payer: Self-pay

## 2022-07-11 DIAGNOSIS — N201 Calculus of ureter: Secondary | ICD-10-CM

## 2022-07-11 DIAGNOSIS — R109 Unspecified abdominal pain: Secondary | ICD-10-CM | POA: Diagnosis not present

## 2022-07-11 DIAGNOSIS — N132 Hydronephrosis with renal and ureteral calculous obstruction: Secondary | ICD-10-CM | POA: Diagnosis not present

## 2022-07-11 LAB — URINALYSIS, ROUTINE W REFLEX MICROSCOPIC
Bacteria, UA: NONE SEEN
Bilirubin Urine: NEGATIVE
Glucose, UA: NEGATIVE mg/dL
Leukocytes,Ua: NEGATIVE
Nitrite: NEGATIVE
Protein, ur: 30 mg/dL — AB
RBC / HPF: >50 RBC/hpf (ref 0–5)
Specific Gravity, Urine: 1.041 — ABNORMAL HIGH (ref 1.005–1.030)
pH: 5.5 (ref 5.0–8.0)

## 2022-07-11 LAB — CBC
HCT: 44.6 % (ref 39.0–52.0)
Hemoglobin: 15.7 g/dL (ref 13.0–17.0)
MCH: 29.6 pg (ref 26.0–34.0)
MCHC: 35.2 g/dL (ref 30.0–36.0)
MCV: 84.2 fL (ref 80.0–100.0)
Platelets: 234 10*3/uL (ref 150–400)
RBC: 5.3 MIL/uL (ref 4.22–5.81)
RDW: 13.4 % (ref 11.5–15.5)
WBC: 7.9 10*3/uL (ref 4.0–10.5)
nRBC: 0 % (ref 0.0–0.2)

## 2022-07-11 LAB — BASIC METABOLIC PANEL
Anion gap: 11 (ref 5–15)
BUN: 15 mg/dL (ref 6–20)
CO2: 25 mmol/L (ref 22–32)
Calcium: 9.5 mg/dL (ref 8.9–10.3)
Chloride: 106 mmol/L (ref 98–111)
Creatinine, Ser: 1.23 mg/dL (ref 0.61–1.24)
GFR, Estimated: >60 mL/min (ref >60)
Glucose, Bld: 112 mg/dL — ABNORMAL HIGH (ref 70–99)
Potassium: 4 mmol/L (ref 3.5–5.1)
Sodium: 142 mmol/L (ref 135–145)

## 2022-07-11 MED ORDER — ONDANSETRON HCL 4 MG/2ML IJ SOLN
4.0000 mg | Freq: Once | INTRAMUSCULAR | Status: AC
  Administered 2022-07-11: 4 mg via INTRAVENOUS
  Filled 2022-07-11: qty 2

## 2022-07-11 MED ORDER — ONDANSETRON 4 MG PO TBDP
4.0000 mg | ORAL_TABLET | Freq: Three times a day (TID) | ORAL | 1 refills | Status: AC | PRN
  Filled 2022-07-11: qty 12, 4d supply, fill #0

## 2022-07-11 MED ORDER — HYDROMORPHONE HCL 1 MG/ML IJ SOLN
1.0000 mg | Freq: Once | INTRAMUSCULAR | Status: AC
  Administered 2022-07-11: 1 mg via INTRAVENOUS
  Filled 2022-07-11: qty 1

## 2022-07-11 MED ORDER — ONDANSETRON HCL 4 MG/2ML IJ SOLN: 4.0000 mg | Freq: Once | INTRAMUSCULAR | Status: DC

## 2022-07-11 MED ORDER — HYDROCODONE-ACETAMINOPHEN 5-325 MG PO TABS
1.0000 | ORAL_TABLET | Freq: Four times a day (QID) | ORAL | 0 refills | Status: AC | PRN
  Filled 2022-07-11 (×2): qty 10, 3d supply, fill #0

## 2022-07-11 MED ORDER — SODIUM CHLORIDE 0.9 % IV SOLN: INTRAVENOUS | Status: DC

## 2022-07-11 MED ORDER — SODIUM CHLORIDE 0.9 % IV BOLUS
500.0000 mL | Freq: Once | INTRAVENOUS | Status: AC
  Administered 2022-07-11: 500 mL via INTRAVENOUS

## 2022-07-11 MED ORDER — KETOROLAC TROMETHAMINE 30 MG/ML IJ SOLN
30.0000 mg | Freq: Once | INTRAMUSCULAR | Status: AC
  Administered 2022-07-11: 30 mg via INTRAVENOUS
  Filled 2022-07-11: qty 1

## 2022-07-11 NOTE — ED Triage Notes (Signed)
In for eval of lower back and left flank pain onset 2230 last pm. Nausea and vomiting. Last dose Motrin 800 mg @ 0600. Sharp pains. H/O kidney stones.

## 2022-07-11 NOTE — Discharge Instructions (Addendum)
Make an appointment to follow-up with urology.  CT scan confirms 6 mm stone halfway down the left ureter.  Hopefully will be able to pass this.  Take the Zofran as needed for nausea and vomiting.  Recommend 800 mg of Motrin every 8 hours for the pain.  Can supplement with the hydrocodone as needed for additional pain relief.

## 2022-07-11 NOTE — ED Provider Notes (Addendum)
[facility] Provider Note   CSN: [id number] Arrival date & time: 07/11/22  [id number]     History  Chief Complaint  Patient presents with   Flank Pain    Jeremy Hunter is Jeremy 57 y.o. male.  Patient with the acute on sat of left back pain left flank pain at 2230 yesterday.  Associated with nausea and vomiting.  Was able to take 800 mg of Motrin with Jeremy little bit of improvement.  But thinks he may have vomited some of that up.  Does have Jeremy history of kidney stones.  Required lithotripsy about 5 years ago.  That was Jeremy right ureteral stone.  Patient does not use tobacco products.       Home Medications Prior to Admission medications   Medication Sig Start Date End Date Taking? Authorizing Provider  ketoconazole (NIZORAL) 2 % cream Apply 1 application topically daily. 03/05/17   Tereasa Coop, PA-C      Allergies    Patient has no known allergies.    Review of Systems   Review of Systems  Constitutional:  Negative for chills and fever.  HENT:  Negative for ear pain and sore throat.   Eyes:  Negative for pain and visual disturbance.  Respiratory:  Negative for cough and shortness of breath.   Cardiovascular:  Negative for chest pain and palpitations.  Gastrointestinal:  Positive for nausea and vomiting. Negative for abdominal pain.  Genitourinary:  Positive for flank pain. Negative for dysuria and hematuria.  Musculoskeletal:  Positive for back pain. Negative for arthralgias.  Skin:  Negative for color change and rash.  Neurological:  Negative for seizures and syncope.  All other systems reviewed and are negative.   Physical Exam Updated Vital Signs BP (!) 165/89   Pulse 64   Temp 97.8 F (36.6 C) (Oral)   Resp 17   Ht 1.702 m (5' 7"$ )   Wt 69.4 kg   SpO2 100%   BMI 23.96 kg/m  Physical Exam Vitals and nursing note reviewed.  Constitutional:      General: He is not in acute distress.    Appearance: Normal appearance. He  is well-developed.  HENT:     Head: Normocephalic and atraumatic.  Eyes:     Conjunctiva/sclera: Conjunctivae normal.  Cardiovascular:     Rate and Rhythm: Normal rate and regular rhythm.     Heart sounds: No murmur heard. Pulmonary:     Effort: Pulmonary effort is normal. No respiratory distress.     Breath sounds: Normal breath sounds.  Abdominal:     Palpations: Abdomen is soft.     Tenderness: There is no abdominal tenderness.  Musculoskeletal:        General: No swelling.     Cervical back: Neck supple.  Skin:    General: Skin is warm and dry.     Capillary Refill: Capillary refill takes less than 2 seconds.  Neurological:     General: No focal deficit present.     Mental Status: He is alert and oriented to person, place, and time.  Psychiatric:        Mood and Affect: Mood normal.     ED Results / Procedures / Treatments   Labs (all labs ordered are listed, but only abnormal results are displayed) Labs Reviewed  BASIC METABOLIC PANEL - Abnormal; Notable for the following components:      Result Value   Glucose, Bld 112 (*)    All  other components within normal limits  CBC  URINALYSIS, ROUTINE W REFLEX MICROSCOPIC    EKG None  Radiology CT Renal Stone Study  Result Date: 07/11/2022 CLINICAL DATA:  Abdominal/flank pain, stone suspected Left flank pain EXAM: CT ABDOMEN AND PELVIS WITHOUT CONTRAST TECHNIQUE: Multidetector CT imaging of the abdomen and pelvis was performed following the standard protocol without IV contrast. RADIATION DOSE REDUCTION: This exam was performed according to the departmental dose-optimization program which includes automated exposure control, adjustment of the mA and/or kV according to patient size and/or use of iterative reconstruction technique. COMPARISON:  12/09/2009 FINDINGS: Lower chest: No pleural or pericardial effusion.  Lung bases clear. Hepatobiliary: No focal liver abnormality is seen. No gallstones, gallbladder wall thickening,  or biliary dilatation. Pancreas: Unremarkable. No pancreatic ductal dilatation or surrounding inflammatory changes. Spleen: Normal in size without focal abnormality. Adrenals/Urinary Tract: Bilateral urolithiasis, largest stone 6 mm left lower pole. There is Jeremy single 2 mm right midpole calculus. Mild left hydronephrosis and proximal ureterectasis down to the level of Jeremy 6 mm calculus at the L2 level. 2.4 cm right upper pole renal cyst showing slow enlargement since 2011; no follow-up recommended. Urinary bladder is nondistended. Stomach/Bowel: Stomach is incompletely distended, unremarkable. Small bowel decompressed. Normal appendix with punctate appendicolith at its tip. The colon is nondilated, unremarkable. Vascular/Lymphatic: No significant vascular findings are present. No enlarged abdominal or pelvic lymph nodes. Reproductive: Mild prostate enlargement Other: Left pelvic phleboliths.  No ascites.  No free air. Musculoskeletal: No acute or significant osseous findings. IMPRESSION: 1. 6 mm left mid ureteral calculus with mild hydronephrosis. 2. Bilateral nephrolithiasis. Electronically Signed   By: Lucrezia Europe M.D.   On: 07/11/2022 08:27    Procedures Procedures    Medications Ordered in ED Medications  0.9 %  sodium chloride infusion ( Intravenous New Bag/Given 07/11/22 0845)  sodium chloride 0.9 % bolus 500 mL (0 mLs Intravenous Stopped 07/11/22 0854)  HYDROmorphone (DILAUDID) injection 1 mg (1 mg Intravenous Given 07/11/22 0805)  ondansetron (ZOFRAN) injection 4 mg (4 mg Intravenous Given 07/11/22 0805)  HYDROmorphone (DILAUDID) injection 1 mg (1 mg Intravenous Given 07/11/22 0849)  ketorolac (TORADOL) 30 MG/ML injection 30 mg (30 mg Intravenous Given 07/11/22 0849)    ED Course/ Medical Decision Making/ Jeremy&P                             Medical Decision Making Amount and/or Complexity of Data Reviewed Labs: ordered. Radiology: ordered.  Risk Prescription drug management.   Patient with Jeremy  history of kidney stones.  Acute onset of left back and left flank pain at 1030 last evening.  Reminds patient of previous kidney stones.  Associated with nausea and vomiting.  Will get labs.  CT renal study.  IV fluids and pain control.  Labs without significant abnormalities.  CBC normal.  Renal function normal.  Electrolytes normal.  Urinalysis still pending.  But clinically no major concern for urinary tract infection.  CT scan renal shows 6 mm left mid ureteral calculus with mild hydronephrosis.  Does have bilateral nephrolithiasis.  And patient has had Jeremy history of kidney stones in the past.  Will treat him symptomatically and have him follow-up with [facility].  Urinalysis normal.  Patient's pain improved significantly with the Toradol.  Will discharge home with follow-up with urology.   Final Clinical Impression(s) / ED Diagnoses Final diagnoses:  Flank pain  Left ureteral stone    Rx / DC  Orders ED Discharge Orders     None         Fredia Sorrow, MD 07/11/22 [id number]    Fredia Sorrow, MD 07/11/22 [id number]    Fredia Sorrow, MD 07/11/22 1104

## 2022-07-13 ENCOUNTER — Ambulatory Visit
Admission: RE | Admit: 2022-07-13 | Discharge: 2022-07-13 | Disposition: A | Discharge status: Home / Self Care | Payer: 59 | Source: Ambulatory Visit

## 2022-07-13 VITALS — BP 161/90 | HR 69 | Temp 98.6°F | Resp 20

## 2022-07-13 DIAGNOSIS — N200 Calculus of kidney: Secondary | ICD-10-CM

## 2022-07-13 DIAGNOSIS — R109 Unspecified abdominal pain: Secondary | ICD-10-CM

## 2022-07-13 MED ORDER — TAMSULOSIN HCL 0.4 MG PO CAPS: 0.4000 mg | ORAL_CAPSULE | Freq: Every day | ORAL | 0 refills | Status: AC

## 2022-07-13 MED ORDER — ACETAMINOPHEN 325 MG PO TABS
650.0000 mg | ORAL_TABLET | Freq: Once | ORAL | Status: AC
  Administered 2022-07-13: 650 mg via ORAL

## 2022-07-13 NOTE — ED Provider Notes (Signed)
Patient was diagnosed with kidney stone on July 11, 2022 at the [place] location.  Patient states overall his symptoms have been present for about 4 days.  Patient was given prescription for Norco 5-325 and advised to take ibuprofen 800 mg for pain.  Patient states neither of these medications are helping his pain.  Patient states he is about to run out of Vicodin and his appointment with a urologist is 8 days from now.  Patient states overall his pain has become worse and more persistent instead of improving.  Patient advised to the emergency room now to optimize his pain management.  Patient was provided with a prescription for Flomax during his visit today due to ED provider not providing one for him.  Patient was also provided with a urine filter which was also not provided.  Otherwise, no billable services were provided for this patient and there should be no charge for this visit.   Lynden Oxford Scales, PA-C 07/13/22 1901

## 2022-07-13 NOTE — ED Triage Notes (Signed)
Pt presents to Urgent Care with c/o persistent L flank pain after being dx w/ kidney stone on 07/11/22. Reports he has been taking motrin and Vicodin as prescribed and is running low on Vicodin; cannot see urology until next week.

## 2022-07-13 NOTE — ED Notes (Signed)
Patient is being discharged from the Urgent Care and sent to the Emergency Department via pov . Per L. Lilia Pro, [place], patient is in need of higher level of care due to severe, uncontrolled pain. Patient is aware and verbalizes understanding of plan of care.  Vitals:   07/13/22 1827  BP: (!) 161/90  Pulse: 69  Resp: 20  Temp: 98.6 F (37 C)  SpO2: 99%

## 2022-07-15 DIAGNOSIS — R109 Unspecified abdominal pain: Secondary | ICD-10-CM | POA: Diagnosis not present

## 2022-07-15 DIAGNOSIS — N200 Calculus of kidney: Secondary | ICD-10-CM | POA: Diagnosis not present

## 2022-07-21 DIAGNOSIS — N201 Calculus of ureter: Secondary | ICD-10-CM | POA: Diagnosis not present

## 2022-07-21 DIAGNOSIS — R93422 Abnormal radiologic findings on diagnostic imaging of left kidney: Secondary | ICD-10-CM | POA: Diagnosis not present

## 2022-07-21 DIAGNOSIS — R109 Unspecified abdominal pain: Secondary | ICD-10-CM | POA: Diagnosis not present

## 2022-07-21 DIAGNOSIS — N281 Cyst of kidney, acquired: Secondary | ICD-10-CM | POA: Diagnosis not present

## 2022-07-21 DIAGNOSIS — N200 Calculus of kidney: Secondary | ICD-10-CM | POA: Diagnosis not present

## 2022-09-03 ENCOUNTER — Other Ambulatory Visit (HOSPITAL_COMMUNITY): Payer: Self-pay

## 2023-01-04 DIAGNOSIS — L814 Other melanin hyperpigmentation: Secondary | ICD-10-CM | POA: Diagnosis not present

## 2023-01-04 DIAGNOSIS — D485 Neoplasm of uncertain behavior of skin: Secondary | ICD-10-CM | POA: Diagnosis not present

## 2023-01-04 DIAGNOSIS — L570 Actinic keratosis: Secondary | ICD-10-CM | POA: Diagnosis not present

## 2023-01-04 DIAGNOSIS — C44519 Basal cell carcinoma of skin of other part of trunk: Secondary | ICD-10-CM | POA: Diagnosis not present

## 2023-01-04 DIAGNOSIS — L218 Other seborrheic dermatitis: Secondary | ICD-10-CM | POA: Diagnosis not present

## 2023-01-04 DIAGNOSIS — L821 Other seborrheic keratosis: Secondary | ICD-10-CM | POA: Diagnosis not present

## 2023-01-04 DIAGNOSIS — L718 Other rosacea: Secondary | ICD-10-CM | POA: Diagnosis not present

## 2023-01-20 DIAGNOSIS — C44519 Basal cell carcinoma of skin of other part of trunk: Secondary | ICD-10-CM | POA: Diagnosis not present

## 2023-04-13 DIAGNOSIS — J209 Acute bronchitis, unspecified: Secondary | ICD-10-CM | POA: Diagnosis not present

## 2023-04-13 DIAGNOSIS — J018 Other acute sinusitis: Secondary | ICD-10-CM | POA: Diagnosis not present

## 2023-04-13 DIAGNOSIS — Z6824 Body mass index (BMI) 24.0-24.9, adult: Secondary | ICD-10-CM | POA: Diagnosis not present

## 2023-07-02 ENCOUNTER — Other Ambulatory Visit (HOSPITAL_BASED_OUTPATIENT_CLINIC_OR_DEPARTMENT_OTHER): Payer: Self-pay

## 2023-07-02 MED ORDER — SILDENAFIL CITRATE 50 MG PO TABS: ORAL_TABLET | ORAL | 1 refills | Status: AC

## 2023-09-14 ENCOUNTER — Other Ambulatory Visit (HOSPITAL_BASED_OUTPATIENT_CLINIC_OR_DEPARTMENT_OTHER): Payer: Self-pay
